# Patient Record
Sex: Female | Born: 1950 | Race: Black or African American | Hispanic: No | Smoking: Never smoker
Health system: Southern US, Community
[De-identification: ages and names within clinical notes are randomized; demographics above are authoritative.]

## PROBLEM LIST (undated history)

## (undated) DIAGNOSIS — M1990 Unspecified osteoarthritis, unspecified site: Secondary | ICD-10-CM

## (undated) DIAGNOSIS — K219 Gastro-esophageal reflux disease without esophagitis: Secondary | ICD-10-CM

## (undated) DIAGNOSIS — T7840XA Allergy, unspecified, initial encounter: Secondary | ICD-10-CM

## (undated) HISTORY — DX: Allergy, unspecified, initial encounter: T78.40XA

## (undated) HISTORY — DX: Unspecified osteoarthritis, unspecified site: M19.90

## (undated) HISTORY — PX: ROTATOR CUFF REPAIR: SHX139

## (undated) HISTORY — PX: PARTIAL HYSTERECTOMY: SHX80

## (undated) HISTORY — PX: HAND TENDON SURGERY: SHX663

## (undated) HISTORY — PX: BREAST LUMPECTOMY: SHX2

## (undated) HISTORY — DX: Gastro-esophageal reflux disease without esophagitis: K21.9

---

## 2015-06-25 HISTORY — PX: COLONOSCOPY: SHX174

## 2018-11-16 ENCOUNTER — Other Ambulatory Visit: Payer: Self-pay | Admitting: Internal Medicine

## 2018-11-16 DIAGNOSIS — Z1231 Encounter for screening mammogram for malignant neoplasm of breast: Secondary | ICD-10-CM

## 2019-01-02 ENCOUNTER — Ambulatory Visit
Admission: RE | Admit: 2019-01-02 | Discharge: 2019-01-02 | Disposition: A | Discharge status: Home / Self Care | Payer: Medicare Other | Source: Ambulatory Visit | Attending: Internal Medicine | Admitting: Internal Medicine

## 2019-01-02 ENCOUNTER — Other Ambulatory Visit: Payer: Self-pay

## 2019-01-02 DIAGNOSIS — Z1231 Encounter for screening mammogram for malignant neoplasm of breast: Secondary | ICD-10-CM

## 2019-05-27 ENCOUNTER — Other Ambulatory Visit: Payer: Self-pay | Admitting: Internal Medicine

## 2019-05-27 DIAGNOSIS — R1032 Left lower quadrant pain: Secondary | ICD-10-CM

## 2019-06-05 ENCOUNTER — Ambulatory Visit
Admission: RE | Admit: 2019-06-05 | Discharge: 2019-06-05 | Disposition: A | Discharge status: Home / Self Care | Payer: Medicare PPO | Source: Ambulatory Visit | Attending: Internal Medicine | Admitting: Internal Medicine

## 2019-06-05 ENCOUNTER — Other Ambulatory Visit: Payer: Self-pay

## 2019-06-05 DIAGNOSIS — R1032 Left lower quadrant pain: Secondary | ICD-10-CM

## 2019-06-05 MED ORDER — IOPAMIDOL (ISOVUE-300) INJECTION 61%
100.0000 mL | Freq: Once | INTRAVENOUS | Status: AC | PRN
  Administered 2019-06-05: 100 mL via INTRAVENOUS

## 2019-06-07 ENCOUNTER — Other Ambulatory Visit: Payer: Self-pay | Admitting: Internal Medicine

## 2019-11-27 ENCOUNTER — Other Ambulatory Visit: Payer: Self-pay | Admitting: Internal Medicine

## 2019-11-27 DIAGNOSIS — Z1231 Encounter for screening mammogram for malignant neoplasm of breast: Secondary | ICD-10-CM

## 2020-01-08 ENCOUNTER — Ambulatory Visit: Payer: Medicare PPO

## 2020-01-10 ENCOUNTER — Encounter: Payer: Self-pay | Admitting: Gastroenterology

## 2020-01-17 ENCOUNTER — Other Ambulatory Visit: Payer: Self-pay

## 2020-01-17 ENCOUNTER — Ambulatory Visit: Payer: Medicare PPO

## 2020-01-17 ENCOUNTER — Ambulatory Visit
Admission: RE | Admit: 2020-01-17 | Discharge: 2020-01-17 | Disposition: A | Discharge status: Home / Self Care | Payer: Medicare PPO | Source: Ambulatory Visit | Attending: Internal Medicine | Admitting: Internal Medicine

## 2020-01-17 DIAGNOSIS — Z1231 Encounter for screening mammogram for malignant neoplasm of breast: Secondary | ICD-10-CM

## 2020-01-17 IMAGING — MG DIGITAL SCREENING BILAT W/ TOMO W/ CAD
6 of 10 series · 6 of 30 positions shown · non-contrast
Comparison: Previous exam(s).

CLINICAL DATA: Screening.

EXAM:
DIGITAL SCREENING BILATERAL MAMMOGRAM WITH TOMO AND CAD

[R MLO synth-2D (1 of 2)]
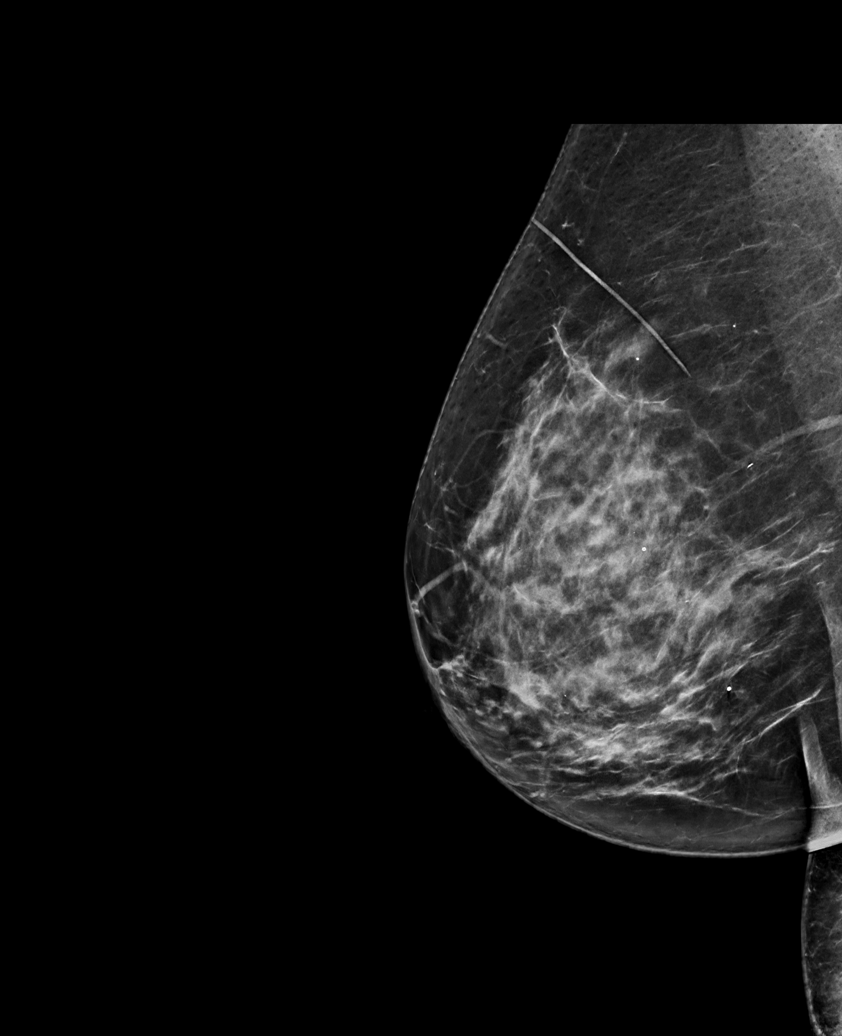

[L CC synth-2D]
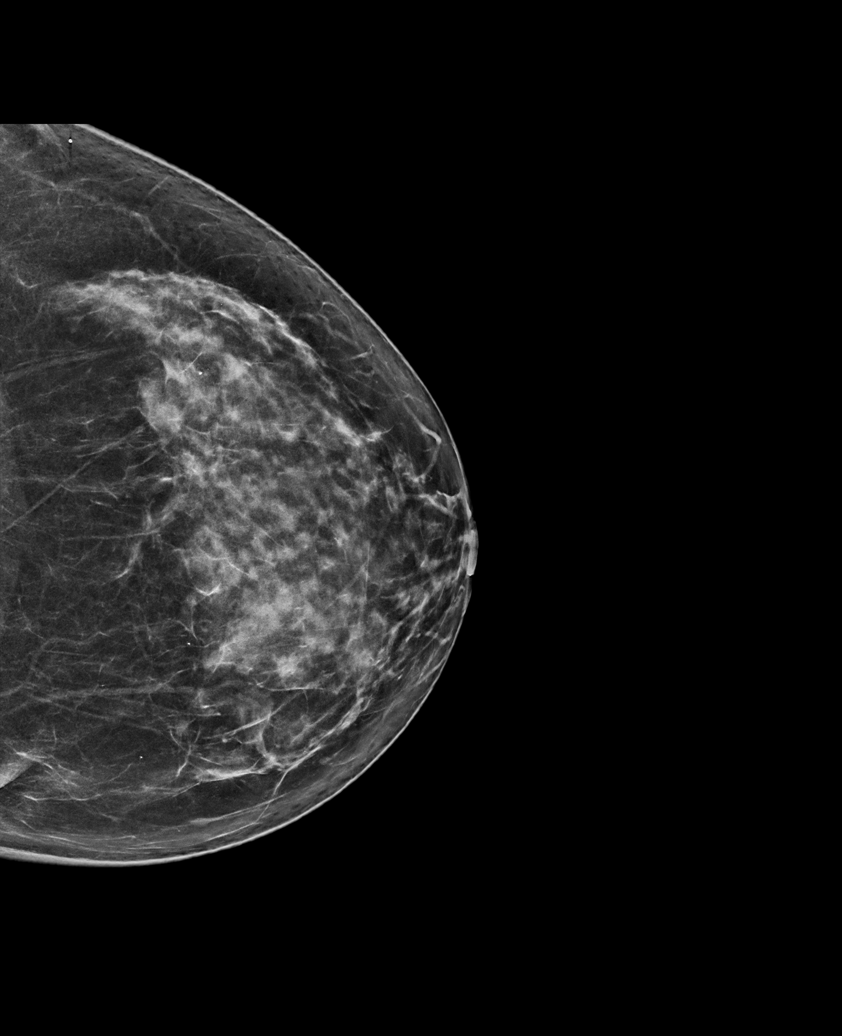

[R CC synth-2D]
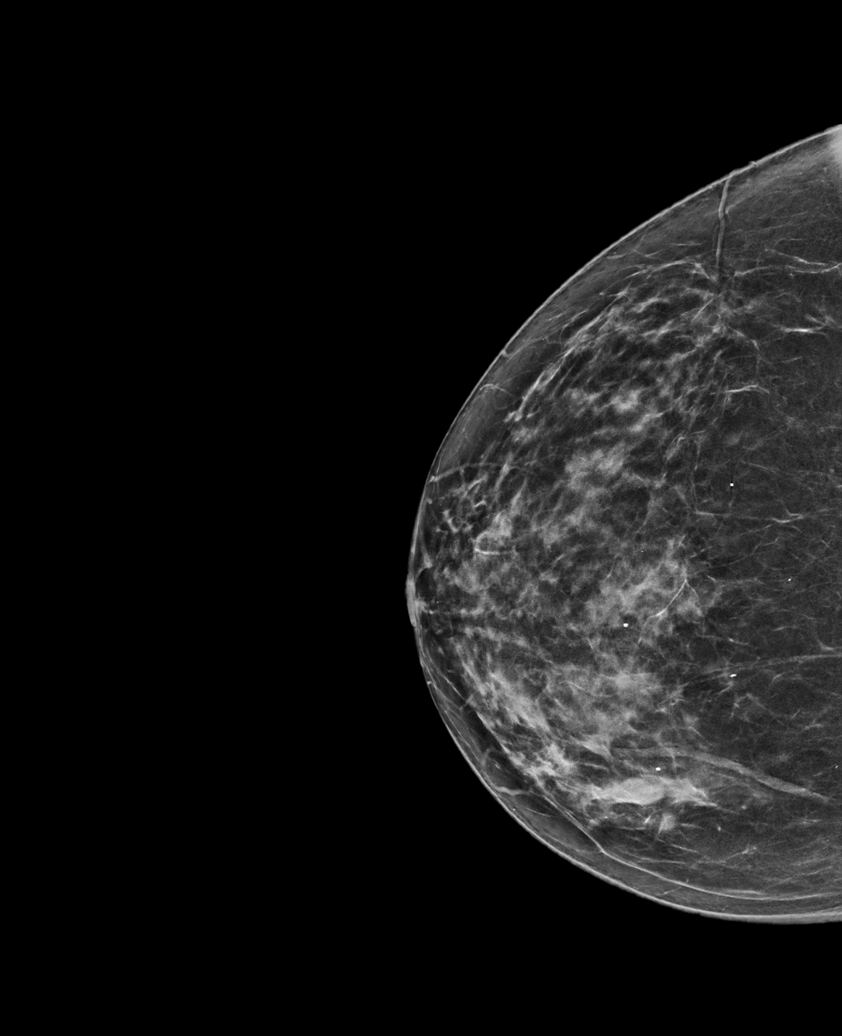

[R MLO synth-2D (2 of 2)]
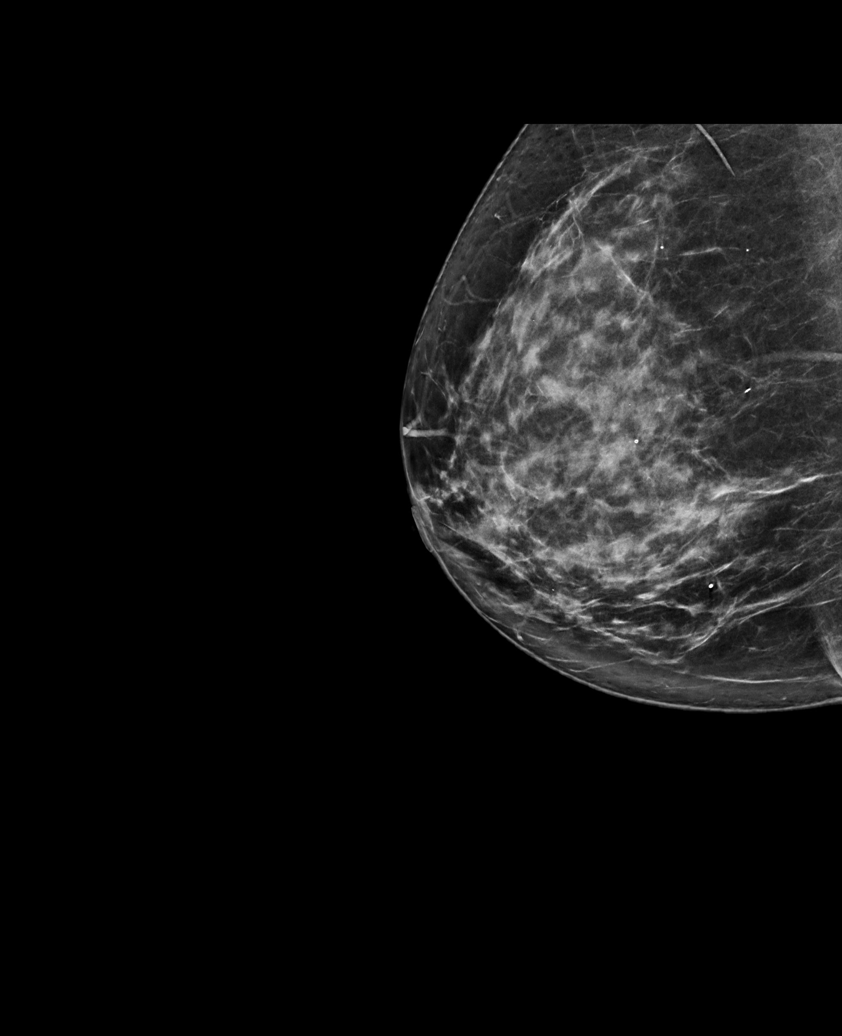

[L MLO synth-2D]
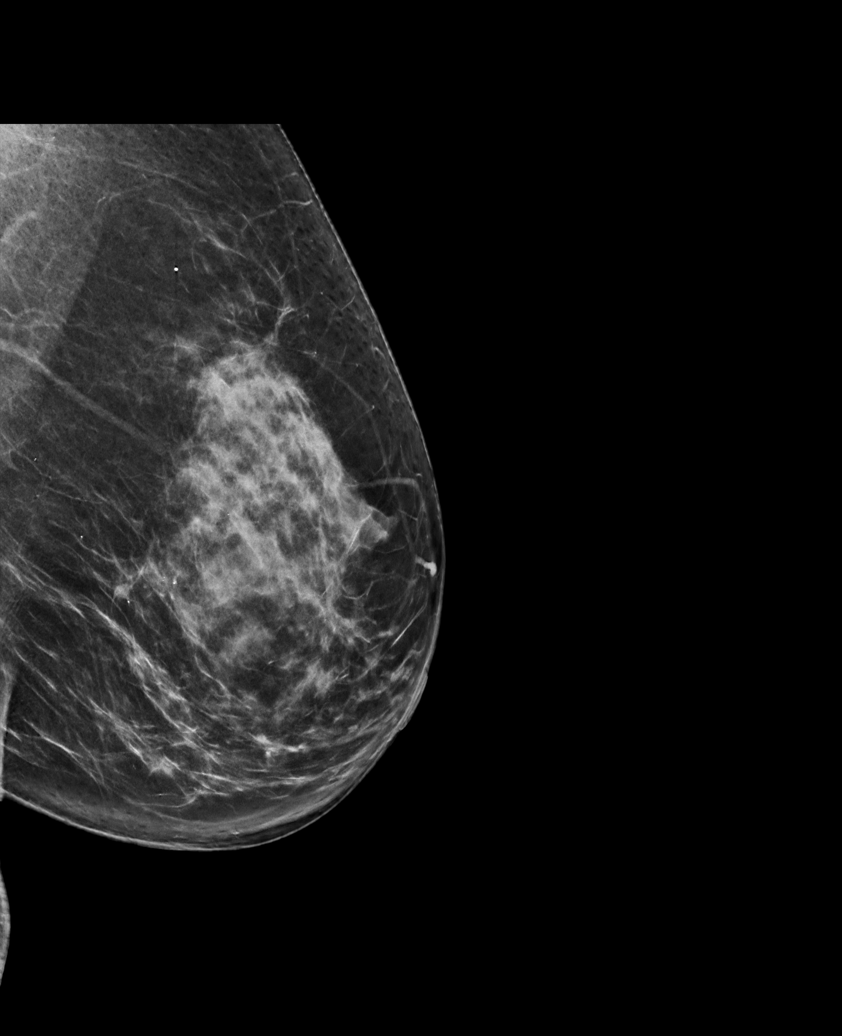

[L CC tomo · tomo slice 37/73.0]
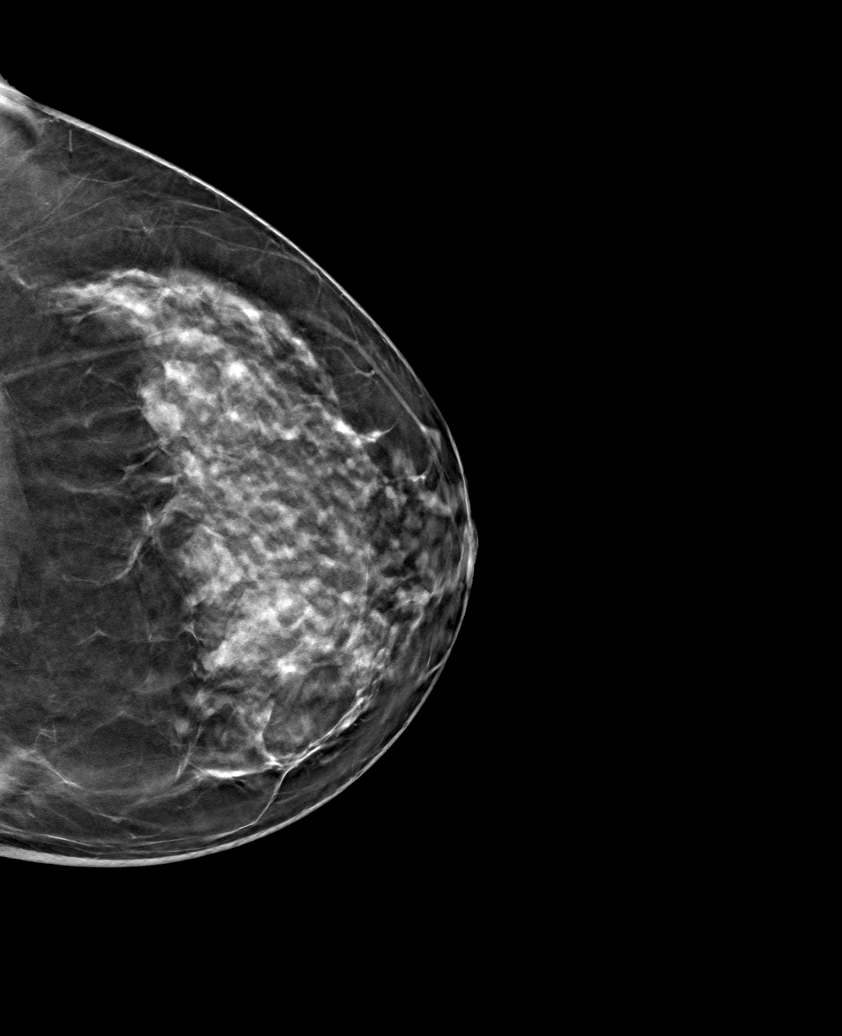

[6 of 30 positions shown; findings below may reference images not displayed]

ACR Breast Density Category c: The breast tissue is heterogeneously
dense, which may obscure small masses.
FINDINGS: There are no findings suspicious for malignancy. Images were
processed with CAD.
IMPRESSION: No mammographic evidence of malignancy. A result letter of this
screening mammogram will be mailed directly to the patient.

RECOMMENDATION:
Screening mammogram in one year. (Code:[5V])

BI-RADS CATEGORY  1: Negative.

## 2020-01-24 ENCOUNTER — Other Ambulatory Visit: Payer: Self-pay | Admitting: Internal Medicine

## 2020-01-24 DIAGNOSIS — R1032 Left lower quadrant pain: Secondary | ICD-10-CM

## 2020-02-13 ENCOUNTER — Other Ambulatory Visit: Payer: Self-pay

## 2020-02-13 ENCOUNTER — Ambulatory Visit
Admission: RE | Admit: 2020-02-13 | Discharge: 2020-02-13 | Disposition: A | Discharge status: Home / Self Care | Payer: Medicare PPO | Source: Ambulatory Visit | Attending: Internal Medicine | Admitting: Internal Medicine

## 2020-02-13 DIAGNOSIS — R1032 Left lower quadrant pain: Secondary | ICD-10-CM

## 2020-02-13 MED ORDER — IOPAMIDOL (ISOVUE-300) INJECTION 61%
100.0000 mL | Freq: Once | INTRAVENOUS | Status: AC | PRN
  Administered 2020-02-13: 100 mL via INTRAVENOUS

## 2020-02-22 ENCOUNTER — Ambulatory Visit: Payer: Medicare PPO

## 2020-04-06 ENCOUNTER — Other Ambulatory Visit: Payer: Self-pay | Admitting: Internal Medicine

## 2020-04-06 DIAGNOSIS — R5381 Other malaise: Secondary | ICD-10-CM

## 2020-04-10 ENCOUNTER — Other Ambulatory Visit: Payer: Self-pay | Admitting: Internal Medicine

## 2020-04-10 DIAGNOSIS — M8589 Other specified disorders of bone density and structure, multiple sites: Secondary | ICD-10-CM

## 2020-04-23 ENCOUNTER — Encounter: Payer: Self-pay | Admitting: Gastroenterology

## 2020-04-23 ENCOUNTER — Ambulatory Visit: Payer: Medicare PPO | Admitting: Gastroenterology

## 2020-04-23 VITALS — BP 134/92 | HR 88 | Ht 68.0 in | Wt 162.5 lb

## 2020-04-23 DIAGNOSIS — Z86010 Personal history of colonic polyps: Secondary | ICD-10-CM | POA: Diagnosis not present

## 2020-04-23 DIAGNOSIS — Z800 Family history of malignant neoplasm of digestive organs: Secondary | ICD-10-CM

## 2020-04-23 MED ORDER — CLENPIQ 10-3.5-12 MG-GM -GM/160ML PO SOLN: 1.0000 | Freq: Once | ORAL | 0 refills | Status: AC

## 2020-04-23 NOTE — Progress Notes (Signed)
Chief Complaint: For colonoscopy.  Referring Provider:  Bonnita Nasuti, MD      ASSESSMENT AND PLAN;   #1. 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 FH colon Ca (sis at age 70)  #3. Abn CT showing liver lesion-likely hemangioma.  Radiology had advised MRI.  Patient wanted to hold off.  Plan: -Colon (clenpiq) for further evaluation.  Discussed risks and benefits. -Blood test test results from Dr Tenna Delaine office in [place] report given. She will discuss with her daughter Investment banker, corporate) and then decide about MRI liver.   HPI:    Audrey Pratt is a 70 y.o. female   For colonoscopy. Occ constipation, better with fiber. Otherwise no GI problems.  No melena or hematochezia.  No weight loss.  She had intermittent left lower quadrant abdominal pain previously.  Underwent CT Abdo/pelvis 02/13/2020 which was negative for any acute abnormalities.  It did show a 3 cm right hepatic lesion likely hemangioma.  Patient refused MRI after.  Reflux symptoms like heartburn is better on omeprazole 20mg  po qd  Sis- had colon Ca at 44 yr  Daughter is a Marine scientist.  I have given CT report to the patient.  She will run it by her daughter and decide.  CT Abdo/pelvis with contrast 02/13/2020 1. No acute intra-abdominal or pelvic pathology. No bowel obstruction. Normal appendix. 2. A 15 mm left adnexal cyst similar to prior CT. Further evaluation with ultrasound recommended. 3. A 3 cm indeterminate right hepatic mass, likely a hemangioma. MRI may provide better characterization. 4. Aortic Atherosclerosis (ICD10-I70.0).  EGD-we do not have the actual report.  She does bring in pictures 06/25/2015: Small hiatal hernia, erosive esophagitis.  We do have biopsies which were negative for Barrett's.  Colonoscopy 06/25/2015: We do not have the actual report.  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  Also had prominent ileocecal valve with negative biopsies.  Advised to repeat in 5 years.  Past Surgical  History:  Procedure Laterality Date  . BREAST LUMPECTOMY    . COLONOSCOPY  06/25/2015   every 5 years  . HAND TENDON SURGERY Right   . PARTIAL HYSTERECTOMY    . ROTATOR CUFF REPAIR Right     Family History  Problem Relation Age of Onset  . Heart disease Mother   . Prostate cancer Father   . Heart disease Father   . Colon cancer Sister   . Throat cancer Brother   . Prostate cancer Brother   . Stomach cancer Maternal Grandmother     Social History   Tobacco Use  . Smoking status: Never Smoker  . Smokeless tobacco: Never Used  Substance Use Topics  . Alcohol use: Never  . Drug use: Never    Current Outpatient Medications  Medication Sig Dispense Refill  . aspirin 81 MG chewable tablet 1 tablet    . Calcium Carbonate+Vitamin D 600-200 MG-UNIT TABS 1 tablet with a meal    . cetirizine (ZYRTEC) 10 MG tablet Zyrtec 10 mg tablet  Take 1 tablet every day by oral route.    . fluocinonide cream (LIDEX) 0.05 %     . Multiple Vitamins-Minerals (CENTRUM SILVER ULTRA WOMENS) TABS See admin instructions.    Marland Kitchen omeprazole (PRILOSEC) 20 MG capsule Take 20 mg by mouth daily. Takes 30 minutes before breakfast    . polyethylene glycol (MIRALAX / GLYCOLAX) 17 g packet 1 packet mixed with 8 ounces of fluid     No current facility-administered medications for this visit.  Not on File  Review of Systems:  Constitutional: Denies fever, chills, diaphoresis, appetite change and fatigue.  HEENT: Denies photophobia, eye pain, redness, hearing loss, ear pain, congestion, sore throat, rhinorrhea, sneezing, mouth sores, neck pain, neck stiffness and tinnitus.   Respiratory: Denies SOB, DOE, cough, chest tightness,  and wheezing.   Cardiovascular: Denies chest pain, palpitations and leg swelling.  Genitourinary: Denies dysuria, urgency, frequency, hematuria, flank pain and difficulty urinating.  Musculoskeletal: Denies myalgias, back pain, joint swelling, arthralgias and gait problem.  Skin: No  rash.  Neurological: Denies dizziness, seizures, syncope, weakness, light-headedness, numbness and headaches.  Hematological: Denies adenopathy. Easy bruising, personal or family bleeding history  Psychiatric/Behavioral: No anxiety or depression     Physical Exam:    BP (!) 134/92 (BP Location: Right Arm, Patient Position: Sitting, Cuff Size: Normal)   Pulse 88   Ht 5\' 8"  (1.727 m)   Wt 162 lb 8 oz (73.7 kg)   BMI 24.71 kg/m  Wt Readings from Last 3 Encounters:  04/23/20 162 lb 8 oz (73.7 kg)   Constitutional:  Well-developed, in no acute distress. Psychiatric: Normal mood and affect. Behavior is normal. HEENT: Pupils normal.  Conjunctivae are normal. No scleral icterus. Neck supple.  Cardiovascular: Normal rate, regular rhythm. No edema Pulmonary/chest: Effort normal and breath sounds normal. No wheezing, rales or rhonchi. Abdominal: Soft, nondistended. Nontender. Bowel sounds active throughout. There are no masses palpable. No hepatomegaly. Rectal: Deferred Neurological: Alert and oriented to person place and time. Skin: Skin is warm and dry. No rashes noted.  Data Reviewed: I have personally reviewed following labs and imaging studies     Carmell Austria, MD 04/23/2020, 10:37 AM  Cc: Bonnita Nasuti, MD

## 2020-04-23 NOTE — Patient Instructions (Addendum)
If you are age 71 or older, your body mass index should be between 23-30. Your Body mass index is 24.71 kg/m. If this is out of the aforementioned range listed, please consider follow up with your Primary Care Provider.  If you are age 28 or younger, your body mass index should be between 19-25. Your Body mass index is 24.71 kg/m. If this is out of the aformentioned range listed, please consider follow up with your Primary Care Provider.   We have given you a sample of clenpiq  You have been scheduled for a colonoscopy. Please follow written instructions given to you at your visit today.  Please pick up your prep supplies at the pharmacy within the next 1-3 days. If you use inhalers (even only as needed), please bring them with you on the day of your procedure.  Thank you,  Dr. Jackquline Denmark

## 2020-04-30 ENCOUNTER — Ambulatory Visit (AMBULATORY_SURGERY_CENTER): Payer: Medicare PPO | Admitting: Gastroenterology

## 2020-04-30 ENCOUNTER — Encounter: Payer: Self-pay | Admitting: Gastroenterology

## 2020-04-30 ENCOUNTER — Other Ambulatory Visit: Payer: Self-pay

## 2020-04-30 VITALS — BP 132/75 | HR 68 | Temp 97.1°F | Resp 15 | Ht 68.0 in | Wt 162.0 lb

## 2020-04-30 DIAGNOSIS — K635 Polyp of colon: Secondary | ICD-10-CM | POA: Diagnosis not present

## 2020-04-30 DIAGNOSIS — D120 Benign neoplasm of cecum: Secondary | ICD-10-CM

## 2020-04-30 DIAGNOSIS — K6389 Other specified diseases of intestine: Secondary | ICD-10-CM

## 2020-04-30 DIAGNOSIS — Z86010 Personal history of colonic polyps: Secondary | ICD-10-CM | POA: Diagnosis not present

## 2020-04-30 MED ORDER — SODIUM CHLORIDE 0.9 % IV SOLN: 500.0000 mL | Freq: Once | INTRAVENOUS | Status: DC

## 2020-04-30 NOTE — Progress Notes (Signed)
Called to room to assist during endoscopic procedure.  Patient ID and intended procedure confirmed with present staff. Received instructions for my participation in the procedure from the performing physician.  

## 2020-04-30 NOTE — Op Note (Signed)
[facility] Patient Name: Audrey Pratt Procedure Date: 04/30/2020 10:38 AM MRN: [id number] Endoscopist: Jackquline Denmark , MD Age: 70 Referring MD:  Date of Birth: [birth year] Gender: Female Account #: [id number] Procedure:                Colonoscopy Indications:              High risk colon cancer surveillance: Personal                            history of colonic polyps Medicines:                Monitored Anesthesia Care Procedure:                Pre-Anesthesia Assessment:                           - Prior to the procedure, a History and Physical                            was performed, and patient medications and                            allergies were reviewed. The patient's tolerance of                            previous anesthesia was also reviewed. The risks                            and benefits of the procedure and the sedation                            options and risks were discussed with the patient.                            All questions were answered, and informed consent                            was obtained. Prior Anticoagulants: The patient has                            taken no previous anticoagulant or antiplatelet                            agents. ASA Grade Assessment: II - A patient with                            mild systemic disease. After reviewing the risks                            and benefits, the patient was deemed in                            satisfactory condition to undergo the procedure.  After obtaining informed consent, the colonoscope                            was passed under direct vision. Throughout the                            procedure, the patient's blood pressure, pulse, and                            oxygen saturations were monitored continuously. The                            Olympus [id number]-[id number] (#[id number]) Colonoscope was                            introduced through the anus and advanced to  the 2                            cm into the ileum. The colonoscopy was performed                            without difficulty. The patient tolerated the                            procedure well. The quality of the bowel                            preparation was good. The terminal ileum, ileocecal                            valve, appendiceal orifice, and rectum were                            photographed. Scope In: 10:45:48 AM Scope Out: 11:00:23 AM Scope Withdrawal Time: 0 hours 11 minutes 44 seconds  Total Procedure Duration: 0 hours 14 minutes 35 seconds  Findings:                 A 6 mm polyp was found in the cecum. The polyp was                            sessile. The polyp was removed with a cold snare.                            Resection and retrieval were complete.                           The colon (entire examined portion) appeared normal                            with well preserved vascular pattern. The colon was                            highly redundant.  Non-bleeding internal hemorrhoids were found during                            retroflexion. The hemorrhoids were moderate.                           The terminal ileum appeared normal.                           The exam was otherwise without abnormality on                            direct and retroflexion views. Complications:            No immediate complications. Estimated Blood Loss:     Estimated blood loss: none. Impression:               - One 6 mm polyp in the cecum, removed with a cold                            snare. Resected and retrieved.                           - Non-bleeding internal hemorrhoids.                           - Otherwise normal colonoscopy to TI. The colon was                            highly redundant. Recommendation:           - Patient has a contact number available for                            emergencies. The signs and symptoms of potential                             delayed complications were discussed with the                            patient. Return to normal activities tomorrow.                            Written discharge instructions were provided to the                            patient.                           - Resume previous diet.                           - Continue present medications.                           - Await pathology results.                           -  Repeat colonoscopy for surveillance based on                            pathology results.                           - The findings and recommendations were discussed                            with the patient's GD Tasia.Jackquline Denmark, MD 04/30/2020 11:05:30 AM This report has been signed electronically.

## 2020-04-30 NOTE — Progress Notes (Signed)
Vs by JD

## 2020-04-30 NOTE — Progress Notes (Signed)
A and O x3. Report to RN. Tolerated MAC anesthesia well.

## 2020-04-30 NOTE — Patient Instructions (Signed)
Handouts Provided:  Polyps  YOU HAD AN ENDOSCOPIC PROCEDURE TODAY AT THE [facility]:   Refer to the procedure report that was given to you for any specific questions about what was found during the examination.  If the procedure report does not answer your questions, please call your gastroenterologist to clarify.  If you requested that your care partner not be given the details of your procedure findings, then the procedure report has been included in a sealed envelope for you to review at your convenience later.  YOU SHOULD EXPECT: Some feelings of bloating in the abdomen. Passage of more gas than usual.  Walking can help get rid of the air that was put into your GI tract during the procedure and reduce the bloating. If you had a lower endoscopy (such as a colonoscopy or flexible sigmoidoscopy) you may notice spotting of blood in your stool or on the toilet paper. If you underwent a bowel prep for your procedure, you may not have a normal bowel movement for a few days.  Please Note:  You might notice some irritation and congestion in your nose or some drainage.  This is from the oxygen used during your procedure.  There is no need for concern and it should clear up in a day or so.  SYMPTOMS TO REPORT IMMEDIATELY:   Following lower endoscopy (colonoscopy or flexible sigmoidoscopy):  Excessive amounts of blood in the stool  Significant tenderness or worsening of abdominal pains  Swelling of the abdomen that is new, acute  Fever of 100F or higher  For urgent or emergent issues, a gastroenterologist can be reached at any hour by calling [phone]. Do not use MyChart messaging for urgent concerns.    DIET:  We do recommend a small meal at first, but then you may proceed to your regular diet.  Drink plenty of fluids but you should avoid alcoholic beverages for 24 hours.  ACTIVITY:  You should plan to take it easy for the rest of today and you should NOT DRIVE or use heavy  machinery until tomorrow (because of the sedation medicines used during the test).    FOLLOW UP: Our staff will call the number listed on your records 48-72 hours following your procedure to check on you and address any questions or concerns that you may have regarding the information given to you following your procedure. If we do not reach you, we will leave a message.  We will attempt to reach you two times.  During this call, we will ask if you have developed any symptoms of COVID 19. If you develop any symptoms (ie: fever, flu-like symptoms, shortness of breath, cough etc.) before then, please call [phone].  If you test positive for Covid 19 in the 2 weeks post procedure, please call and report this information to us.    If any biopsies were taken you will be contacted by phone or by letter within the next 1-3 weeks.  Please call us at [phone] if you have not heard about the biopsies in 3 weeks.    SIGNATURES/CONFIDENTIALITY: You and/or your care partner have signed paperwork which will be entered into your electronic medical record.  These signatures attest to the fact that that the information above on your After Visit Summary has been reviewed and is understood.  Full responsibility of the confidentiality of this discharge information lies with you and/or your care-partner.  

## 2020-05-02 ENCOUNTER — Telehealth: Payer: Self-pay

## 2020-05-02 NOTE — Telephone Encounter (Signed)
  Follow up Call-  Call back number 04/30/2020  Post procedure Call Back phone  # [phone]  Permission to leave phone message Yes     Patient questions:  Do you have a fever, pain , or abdominal swelling? No. Pain Score  0 *  Have you tolerated food without any problems? Yes.    Have you been able to return to your normal activities? Yes.    Do you have any questions about your discharge instructions: Diet   No. Medications  No. Follow up visit  No.  Do you have questions or concerns about your Care? No.  Actions: * If pain score is 4 or above: No action needed, pain <4. 1. Have you developed a fever since your procedure? no  2.   Have you had an respiratory symptoms (SOB or cough) since your procedure? no  3.   Have you tested positive for COVID 19 since your procedure no  4.   Have you had any family members/close contacts diagnosed with the COVID 19 since your procedure?  no   If yes to any of these questions please route to Joylene John, RN and Joella Prince, RN

## 2020-05-07 ENCOUNTER — Encounter: Payer: Self-pay | Admitting: Gastroenterology

## 2020-08-19 ENCOUNTER — Other Ambulatory Visit: Payer: Self-pay

## 2020-08-19 ENCOUNTER — Ambulatory Visit
Admission: RE | Admit: 2020-08-19 | Discharge: 2020-08-19 | Disposition: A | Discharge status: Home / Self Care | Payer: Medicare PPO | Source: Ambulatory Visit | Attending: Internal Medicine | Admitting: Internal Medicine

## 2020-08-19 DIAGNOSIS — M8589 Other specified disorders of bone density and structure, multiple sites: Secondary | ICD-10-CM

## 2020-09-06 ENCOUNTER — Other Ambulatory Visit: Payer: Self-pay | Admitting: Internal Medicine

## 2020-09-11 ENCOUNTER — Other Ambulatory Visit: Payer: Self-pay | Admitting: Internal Medicine

## 2020-09-11 DIAGNOSIS — R1084 Generalized abdominal pain: Secondary | ICD-10-CM

## 2020-10-02 ENCOUNTER — Ambulatory Visit
Admission: RE | Admit: 2020-10-02 | Discharge: 2020-10-02 | Disposition: A | Discharge status: Home / Self Care | Payer: Medicare PPO | Source: Ambulatory Visit | Attending: Internal Medicine | Admitting: Internal Medicine

## 2020-10-02 ENCOUNTER — Other Ambulatory Visit: Payer: Self-pay

## 2020-10-02 DIAGNOSIS — R1084 Generalized abdominal pain: Secondary | ICD-10-CM

## 2020-10-02 MED ORDER — IOPAMIDOL (ISOVUE-300) INJECTION 61%
100.0000 mL | Freq: Once | INTRAVENOUS | Status: AC | PRN
  Administered 2020-10-02: 100 mL via INTRAVENOUS

## 2020-10-15 ENCOUNTER — Other Ambulatory Visit: Payer: Self-pay | Admitting: Internal Medicine

## 2020-10-15 DIAGNOSIS — N644 Mastodynia: Secondary | ICD-10-CM

## 2020-11-15 ENCOUNTER — Ambulatory Visit
Admission: RE | Admit: 2020-11-15 | Discharge: 2020-11-15 | Disposition: A | Discharge status: Home / Self Care | Payer: Medicare PPO | Source: Ambulatory Visit | Attending: Internal Medicine | Admitting: Internal Medicine

## 2020-11-15 ENCOUNTER — Ambulatory Visit: Payer: Medicare PPO

## 2020-11-15 ENCOUNTER — Other Ambulatory Visit: Payer: Self-pay

## 2020-11-15 DIAGNOSIS — N644 Mastodynia: Secondary | ICD-10-CM

## 2021-02-14 ENCOUNTER — Other Ambulatory Visit: Payer: Self-pay | Admitting: Internal Medicine

## 2021-02-14 DIAGNOSIS — Z1231 Encounter for screening mammogram for malignant neoplasm of breast: Secondary | ICD-10-CM

## 2021-03-21 ENCOUNTER — Ambulatory Visit
Admission: RE | Admit: 2021-03-21 | Discharge: 2021-03-21 | Disposition: A | Discharge status: Home / Self Care | Payer: Medicare PPO | Source: Ambulatory Visit | Attending: Internal Medicine | Admitting: Internal Medicine

## 2021-03-21 DIAGNOSIS — Z1231 Encounter for screening mammogram for malignant neoplasm of breast: Secondary | ICD-10-CM

## 2021-03-28 ENCOUNTER — Ambulatory Visit: Payer: Medicare PPO | Admitting: Gastroenterology

## 2021-04-15 ENCOUNTER — Other Ambulatory Visit: Payer: Self-pay

## 2021-04-15 ENCOUNTER — Ambulatory Visit: Payer: Medicare PPO | Admitting: Gastroenterology

## 2021-04-15 ENCOUNTER — Encounter: Payer: Self-pay | Admitting: Gastroenterology

## 2021-04-15 VITALS — BP 128/80 | HR 71 | Ht 67.0 in | Wt 145.4 lb

## 2021-04-15 DIAGNOSIS — Z800 Family history of malignant neoplasm of digestive organs: Secondary | ICD-10-CM

## 2021-04-15 DIAGNOSIS — Z86010 Personal history of colonic polyps: Secondary | ICD-10-CM

## 2021-04-15 MED ORDER — FLUTICASONE PROPIONATE 0.05 % EX CREA: TOPICAL_CREAM | Freq: Two times a day (BID) | CUTANEOUS | 2 refills | Status: DC

## 2021-04-15 NOTE — Progress Notes (Signed)
Chief Complaint: For FU  Referring Provider:  Bonnita Nasuti, MD      ASSESSMENT AND PLAN;   #1. IBS-C. Neg colon 04/2020.  #2. FH colon Ca (sis at age 71). Rpt colon due 04/2025.  #3. Abn CT showing liver lesion-likely hemangioma.  Radiology had advised MRI.  Patient wanted to hold off.  #4.  Internal hemorrhoids.  Plan:  -Benefiber 1 TBS p.o. QD with 8 oz of water. -Salads 1/QOD. -fluticasone cream 0.05% generic 30g 1 bid PR x 10 days, 2 refills -If still with problems in 2 weeks, add Mg 400mg /day. -Continue Linzess for now. May be able to wean off   HPI:    Audrey Pratt is a 71 y.o. female  For follow-up visit.  Continues to struggle with constipation.  Was better with high-fiber diet.  Currently on Linzess 290 mcg po QD. has not been drinking enough water.  Bms 1/day but hard.  Recently had hemorrhoidal banding by ?? Dr Collene Mares (could only tell me that it was female gastroenterologist).  Has lower abdominal discomfort which gets better with defecation.  Denies having any nausea, vomiting.  Underwent CT Abdo/pelvis 02/13/2020 which was negative for any acute abnormalities.  It did show a 3 cm right hepatic lesion likely hemangioma.  Patient refused MRI after.  Reflux symptoms like heartburn is better on omeprazole 20mg  po qd  Sis- had colon Ca at 74 yr  Daughter is a Marine scientist.  I have given CT report to the patient previously.     Past GI work-up: Colon 04/30/2020 - One 6 mm polyp in the cecum, removed with a cold snare. Resected and retrieved. - Non-bleeding internal hemorrhoids. - Otherwise normal colonoscopy to TI. The colon was highly redundant. - Rpt in 5 yrs d/t FH  CT Abdo/pelvis with contrast 02/13/2020 1. No acute intra-abdominal or pelvic pathology. No bowel obstruction. Normal appendix. 2. A 15 mm left adnexal cyst similar to prior CT. Further evaluation with ultrasound recommended. 3. A 3 cm indeterminate right hepatic mass, likely a hemangioma.  MRI may provide better characterization. 4. Aortic Atherosclerosis (ICD10-I70.0).  EGD-we do not have the actual report.  She does bring in pictures 06/25/2015: Small hiatal hernia, erosive esophagitis.  We do have biopsies which were negative for Barrett's.  Colonoscopy 06/25/2015: We do not have the actual report.  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  Also had prominent ileocecal valve with negative biopsies.  Advised to repeat in 5 years.  Past Surgical History:  Procedure Laterality Date   BREAST LUMPECTOMY Right    COLONOSCOPY  06/25/2015   every 5 years   HAND TENDON SURGERY Right    PARTIAL HYSTERECTOMY     ROTATOR CUFF REPAIR Right     Family History  Problem Relation Age of Onset   Heart disease Mother    Prostate cancer Father    Heart disease Father    Colon cancer Sister    Throat cancer Brother    Prostate cancer Brother    Stomach cancer Maternal Grandmother    Rectal cancer Neg Hx    Esophageal cancer Neg Hx     Social History   Tobacco Use   Smoking status: Never   Smokeless tobacco: Never  Vaping Use   Vaping Use: Never used  Substance Use Topics   Alcohol use: Never   Drug use: Never    Current Outpatient Medications  Medication Sig Dispense Refill   aspirin 81 MG chewable tablet 1  tablet     Calcium Carbonate+Vitamin D 600-200 MG-UNIT TABS 1 tablet with a meal     cetirizine (ZYRTEC) 10 MG tablet Zyrtec 10 mg tablet  Take 1 tablet every day by oral route.     Cholecalciferol (VITAMIN D3 PO) Take 1 tablet by mouth daily in the afternoon.     fluocinonide cream (LIDEX) 0.05 %      linaclotide (LINZESS) 290 MCG CAPS capsule 1 capsule daily in the afternoon.     Multiple Vitamins-Minerals (CENTRUM SILVER ULTRA WOMENS) TABS See admin instructions.     Multiple Vitamins-Minerals (ZINC PO) Take 1 tablet by mouth every other day.     omeprazole (PRILOSEC) 20 MG capsule Take 20 mg by mouth daily. Takes 30 minutes before breakfast      polyethylene glycol (MIRALAX / GLYCOLAX) 17 g packet 1 packet mixed with 8 ounces of fluid     No current facility-administered medications for this visit.    Allergies  Allergen Reactions   Latex Rash    Review of Systems:  neg     Physical Exam:    BP 128/80    Pulse 71    Ht 5\' 7"  (1.702 m)    Wt 145 lb 6 oz (65.9 kg)    BMI 22.77 kg/m  Wt Readings from Last 3 Encounters:  04/15/21 145 lb 6 oz (65.9 kg)  04/30/20 162 lb (73.5 kg)  04/23/20 162 lb 8 oz (73.7 kg)   Constitutional:  Well-developed, in no acute distress. Psychiatric: Normal mood and affect. Behavior is normal. HEENT: Conjunctivae are normal. No scleral icterus. Cardiovascular: Normal rate, regular rhythm. No edema Pulmonary/chest: Effort normal and breath sounds normal. No wheezing, rales or rhonchi. Abdominal: Soft, nondistended. Nontender. Bowel sounds active throughout. There are no masses palpable. No hepatomegaly. Rectal: Deferred Neurological: Alert and oriented to person place and time. Skin: Skin is warm and dry. No rashes noted.  Data Reviewed: I have personally reviewed following labs and imaging studies     Carmell Austria, MD 04/15/2021, 4:18 PM  Cc: Bonnita Nasuti, MD

## 2021-04-15 NOTE — Patient Instructions (Addendum)
If you are age 72 or older, your body mass index should be between 23-30. Your Body mass index is 22.77 kg/m. If this is out of the aforementioned range listed, please consider follow up with your Primary Care Provider. __________________________________________________________  The [facility] providers would like to encourage you to use [facility] to communicate with providers for non-urgent requests or questions.  Due to long hold times on the telephone, sending your provider a message by [facility] may be a faster and more efficient way to get a response.  Please allow 48 business hours for a response.  Please remember that this is for non-urgent requests.    Due to recent changes in healthcare laws, you may see the results of your imaging and laboratory studies on MyChart before your provider has had a chance to review them.  We understand that in some cases there may be results that are confusing or concerning to you. Not all laboratory results come back in the same time frame and the provider may be waiting for multiple results in order to interpret others.  Please give [place] 48 hours in order for your provider to thoroughly review all the results before contacting the office for clarification of your results.    We have sent the following medications to your pharmacy for you to pick up at your convenience: Fluticasone cream.  Please take Benefiber 1 TBS everyday with 8 oz of water. Take Salads every other day. If still with problems in 2 weeks, you can add Magnesia 400mg  everyday Continue Linzess for now.  It was a pleasure to see you today!  Jackquline Denmark, M.D.

## 2021-06-27 ENCOUNTER — Encounter: Payer: Self-pay | Admitting: Gastroenterology

## 2021-06-27 ENCOUNTER — Ambulatory Visit (INDEPENDENT_AMBULATORY_CARE_PROVIDER_SITE_OTHER): Payer: Medicare PPO | Admitting: Gastroenterology

## 2021-06-27 VITALS — BP 122/78 | HR 84 | Ht 67.0 in | Wt 142.2 lb

## 2021-06-27 DIAGNOSIS — R9389 Abnormal findings on diagnostic imaging of other specified body structures: Secondary | ICD-10-CM

## 2021-06-27 DIAGNOSIS — K581 Irritable bowel syndrome with constipation: Secondary | ICD-10-CM | POA: Diagnosis not present

## 2021-06-27 NOTE — Progress Notes (Signed)
? ? ?Chief Complaint: For FU ? ?Referring Provider:  Bonnita Nasuti, MD    ? ? ?ASSESSMENT AND PLAN;  ? ?#1. IBS-C. Neg colon 04/2020. ? ?#2. FH colon Ca (sis at age 71). Rpt colon due 04/2025. ? ?#3. Abn CT 09/2020 showing liver lesion-likely hemangioma.  Radiology had advised MRI.  Patient wanted to hold off. ? ?#4.  Internal hemorrhoids. Better with fluticasone cream 0.05%  ? ?Plan: ? ?-Benefiber 1 TBS p.o. QD with 8 oz of water. ?-Salads 1/QOD. ?-Add Mg '200mg'$ /day (Nl Cr) ?-Continue Linzess 243mg po QOD.  ?-She will call in 2 weeks. ?-Obtain latest blood tests from Dr HTenna Delaineoffice. ? ? ?HPI:   ? ?MRichard Holzis a 71y.o. female  ?For follow-up visit. ? ?Doing better from constipation standpoint.  About 70 to 80% better with fiber, salads and Linzess every other day.  Pleased with the progress. ? ?The hemorrhoids are much better with current treatment. ? ?Denies having any nausea, vomiting. ? ?Underwent CT Abdo/pelvis 02/13/2020 which was negative for any acute abnormalities.  It did show a 3 cm right hepatic lesion likely hemangioma.  Patient refused MRI after. ? ?Reflux symptoms like heartburn is better on omeprazole '20mg'$  po qd ? ?Sis- had colon Ca at 89yr ? ?Daughter is a nMarine scientist  I have given CT report to the patient previously.   ? ? ?Past GI work-up: ?Colon 04/30/2020 ?- One 6 mm polyp in the cecum, removed with a cold snare. Resected and retrieved. ?- Non-bleeding internal hemorrhoids. ?- Otherwise normal colonoscopy to TI. The colon was highly redundant. ?- Rpt in 5 yrs d/t FH ? ?CT AP with contrast 09/2020 ?Large amount of stool seen throughout the colon. ?No acute abnormality seen in the abdomen or pelvis. ?Aortic Atherosclerosis (ICD10-I70.0). ? ?CT Abdo/pelvis with contrast 02/13/2020 ?1. No acute intra-abdominal or pelvic pathology. No bowel ?obstruction. Normal appendix. ?2. A 15 mm left adnexal cyst similar to prior CT. Further evaluation ?with ultrasound recommended. ?3. A 3 cm indeterminate right  hepatic mass, likely a hemangioma. MRI ?may provide better characterization. ?4. Aortic Atherosclerosis (ICD10-I70.0). ? ?EGD-we do not have the actual report.  She does bring in pictures 06/25/2015: Small hiatal hernia, erosive esophagitis.  We do have biopsies which were negative for Barrett's. ? ?Colonoscopy 06/25/2015: We do not have the actual report.  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  Also had prominent ileocecal valve with negative biopsies.  Advised to repeat in 5 years. ? ?Past Surgical History:  ?Procedure Laterality Date  ? BREAST LUMPECTOMY Right   ? COLONOSCOPY  06/25/2015  ? every 5 years  ? HAND TENDON SURGERY Right   ? PARTIAL HYSTERECTOMY    ? ROTATOR CUFF REPAIR Right   ? ? ?Family History  ?Problem Relation Age of Onset  ? Heart disease Mother   ? Prostate cancer Father   ? Heart disease Father   ? Colon cancer Sister   ? Throat cancer Brother   ? Prostate cancer Brother   ? Stomach cancer Maternal Grandmother   ? Rectal cancer Neg Hx   ? Esophageal cancer Neg Hx   ? ? ?Social History  ? ?Tobacco Use  ? Smoking status: Never  ? Smokeless tobacco: Never  ?Vaping Use  ? Vaping Use: Never used  ?Substance Use Topics  ? Alcohol use: Never  ? Drug use: Never  ? ? ?Current Outpatient Medications  ?Medication Sig Dispense Refill  ? aspirin 81 MG chewable tablet 1  tablet    ? Calcium Carbonate+Vitamin D 600-200 MG-UNIT TABS 1 tablet with a meal    ? cetirizine (ZYRTEC) 10 MG tablet Zyrtec 10 mg tablet ? Take 1 tablet every day by oral route.    ? Cholecalciferol (VITAMIN D3 PO) Take 1 tablet by mouth daily in the afternoon.    ? fluocinonide cream (LIDEX) 0.05 %     ? fluticasone (CUTIVATE) 0.05 % cream Apply topically 2 (two) times daily. 30 g 2  ? linaclotide (LINZESS) 290 MCG CAPS capsule 1 capsule daily in the afternoon.    ? Multiple Vitamins-Minerals (CENTRUM SILVER ULTRA WOMENS) TABS See admin instructions.    ? Multiple Vitamins-Minerals (ZINC PO) Take 1 tablet by mouth every  other day.    ? omeprazole (PRILOSEC) 20 MG capsule Take 20 mg by mouth daily. Takes 30 minutes before breakfast    ? Wheat Dextrin (BENEFIBER PO) Take 3 tablets by mouth 3 (three) times daily with meals.    ? ?No current facility-administered medications for this visit.  ? ? ?Allergies  ?Allergen Reactions  ? Latex Rash  ? ? ?Review of Systems:  ?neg ? ?  ? ?Physical Exam:   ? ?BP 122/78   Pulse 84   Ht '5\' 7"'$  (1.702 m)   Wt 142 lb 4 oz (64.5 kg)   SpO2 99%   BMI 22.28 kg/m?  ?Wt Readings from Last 3 Encounters:  ?06/27/21 142 lb 4 oz (64.5 kg)  ?04/15/21 145 lb 6 oz (65.9 kg)  ?04/30/20 162 lb (73.5 kg)  ? ?Constitutional:  Well-developed, in no acute distress. ?Psychiatric: Normal mood and affect. Behavior is normal. ?HEENT: Conjunctivae are normal. No scleral icterus. ?Cardiovascular: Normal rate, regular rhythm. No edema ?Pulmonary/chest: Effort normal and breath sounds normal. No wheezing, rales or rhonchi. ?Abdominal: Soft, nondistended. Nontender. Bowel sounds active throughout. There are no masses palpable. No hepatomegaly. ?Rectal: Deferred ?Neurological: Alert and oriented to person place and time. ?Skin: Skin is warm and dry. No rashes noted. ? ?Data Reviewed: I have personally reviewed following labs and imaging studies ? ? ? ? ?Carmell Austria, MD 06/27/2021, 2:13 PM ? ?Cc: Hague, Rosalyn Charters, MD ? ? ?

## 2021-06-27 NOTE — Patient Instructions (Addendum)
If you are age 72 or older, your body mass index should be between 23-30. Your Body mass index is 22.28 kg/m?Marland Kitchen If this is out of the aforementioned range listed, please consider follow up with your Primary Care Provider. ? ?If you are age 47 or younger, your body mass index should be between 19-25. Your Body mass index is 22.28 kg/m?Marland Kitchen If this is out of the aformentioned range listed, please consider follow up with your Primary Care Provider.  ? ?________________________________________________________ ? ?The [place] [facility] providers would like to encourage you to use [facility] to communicate with providers for non-urgent requests or questions.  Due to long hold times on the telephone, sending your provider a message by [facility] may be a faster and more efficient way to get a response.  Please allow 48 business hours for a response.  Please remember that this is for non-urgent requests.  ?_______________________________________________________ ? ?Add Magnesium '200mg'$  daily ? ?Benefiber 1 tablespoon QD in 8oz of water ? ?1 salad every other day ? ?Continue Linzess 247mg every other day ? ?Call in 2 weeks with an update to the nurse ? ?Thank you, ? ?Dr. RJackquline Denmark? ? ? ? ? ?We want to thank you for [facility] your [facility]. All of our staff and providers value the relationships we have built with our patients, and it is an honor to care for you.  ? ?We are writing to let you know that [facility] on Jul 28, 2021, and we invite you to continue to see Dr. RCarmell Austriaand VGerrit Heckat the [facility] location. We are consolidating our serices at these [facility] to better provide care. Our office staff will work with you to ensure a seamless transition.  ? ?VGerrit Heck DO -Dr. CBryan Lemmawill be movig to [facility] at [address] [place] [id number] effective Jul 28, 2021.  Contact [phone]  [phone] to schedule an appointment with him.  ? ?RCarmell Austria MD- Dr. GLyndel Safewill be movig [facility] at [address] [place] [address] effective Jul 28, 2021.  Contact [phone] to schedule an appointment with him.  ? ?Requesting Medical Records ?If you need to request your medical records, please follow the instructions below. Your medical records are confidential, and a copy can be transferred to another provider or released to you or another person you designate only with your permission. ? ?There are several ways to request your medical records: ?Requests for medical records can be submitted through our practice.   ?You can also request your records electronically, in your MyChart account by selecting the ?Request Health Records? tab.  ?If you need additional information on how to request records, please go to [email or web address] choose Patient Information, then select Request Medical Records. ?To make an appointment or if you have any questions about your health care needs, please contact our office at [phone] one of our staff members will be glad to assist you. ?[facility] is committed to providing exceptional care for you and our community. Thank you for allowing [facility] serve your health care needs. ?Sincerely, ? ?SWindy Canny Director [facility] ?[facility] also offers convenient virtual care options. Sore throat? Sinus problems? Cold or flu symptoms? Get care from the comfort of home with [facility] Vis[facility] and e-Visits. Learn more about the non-emergency conditions treated and start your virtual visit at [email or web address] ?

## 2021-10-15 ENCOUNTER — Encounter: Payer: Self-pay | Admitting: Gastroenterology

## 2021-10-15 ENCOUNTER — Ambulatory Visit: Payer: Medicare PPO | Admitting: Gastroenterology

## 2021-10-15 VITALS — BP 134/80 | HR 84 | Ht 66.75 in | Wt 145.4 lb

## 2021-10-15 DIAGNOSIS — K5909 Other constipation: Secondary | ICD-10-CM

## 2021-10-15 DIAGNOSIS — K649 Unspecified hemorrhoids: Secondary | ICD-10-CM

## 2021-10-15 DIAGNOSIS — K769 Liver disease, unspecified: Secondary | ICD-10-CM

## 2021-10-15 MED ORDER — HYDROCORTISONE (PERIANAL) 2.5 % EX CREA: 1.0000 | TOPICAL_CREAM | Freq: Three times a day (TID) | CUTANEOUS | 1 refills | Status: DC

## 2021-10-15 NOTE — Patient Instructions (Signed)
_______________________________________________________  If you are age 72 or older, your body mass index should be between 23-30. Your Body mass index is 22.94 kg/m. If this is out of the aforementioned range listed, please consider follow up with your Primary Care Provider.  If you are age 46 or younger, your body mass index should be between 19-25. Your Body mass index is 22.94 kg/m. If this is out of the aformentioned range listed, please consider follow up with your Primary Care Provider.   We have sent the following medications to your pharmacy for you to pick up at your convenience: Hydrocortisone cream 2.5 % on Preparation H suppository.   You will be contacted by [facility] in the next 2 days to arrange a MRI Liver .  The number on your caller ID will be [phone], please answer when they call.  If you have not heard from them in 2 days please call [phone] to schedule.    The [place] [facility] providers would like to encourage you to use [facility] to communicate with providers for non-urgent requests or questions.  Due to long hold times on the telephone, sending your provider a message by [facility] may be a faster and more efficient way to get a response.  Please allow 48 business hours for a response.  Please remember that this is for non-urgent requests.   It was a pleasure to see you today!  Thank you for trusting me with your gastrointestinal care!    Alonza Bogus, PA-C

## 2021-10-15 NOTE — Progress Notes (Signed)
10/15/2021 Audrey Pratt [id number] 1950/12/29   HISTORY OF PRESENT ILLNESS: This is a 71 year old female is a patient of Dr. Steve Rattler.  She follows here for issues with chronic constipation.  Is currently on Linzess 290 mcg every other day alternating days with doses of magnesium.  Is also using Benefiber.  She had previously been on the Linzess every day, but at some point this year Dr. Lyndel Safe had her go to every other day and alternate that with the magnesium.  She reports that she was never having diarrhea with the Linzess on a daily basis.  Is still struggling with some constipation at this point.  Has intermittent hard stools and having to strain even though she knows that she is not supposed to.  She says that with the straining obviously she has irritated her hemorrhoids with some intermittent bright red blood on the toilet paper and in the toilet with some discomfort as well.  Colonoscopy 04/2020:  - One 6 mm polyp in the cecum, removed with a cold snare. Resected and retrieved. - Non-bleeding internal hemorrhoids. - Otherwise normal colonoscopy to TI. The colon was highly redundant.  Surgical [P], colon, cecum, polyp - BENIGN ENDOMETRIAL POLYP WITH FOCAL HYPERPLASTIC CHANGES - NO HYPERPLASIA OR MALIGNANCY IDENTIFIED - SEE COMMENT  Previously had a CT scan that showed suspicion for a liver hemangioma.  They suggested an MRI to confirm this and Dr. Lyndel Safe discussed this with her previously, but she declined at that time.  She would like to go ahead and proceed with that now.  Past Medical History:  Diagnosis Date   Allergy    Arthritis    GERD (gastroesophageal reflux disease)    Past Surgical History:  Procedure Laterality Date   BREAST LUMPECTOMY Right    COLONOSCOPY  06/25/2015   every 5 years   HAND TENDON SURGERY Right    PARTIAL HYSTERECTOMY     ROTATOR CUFF REPAIR Right     reports that she has never smoked. She has never used smokeless tobacco. She reports that she  does not drink alcohol and does not use drugs. family history includes Colon cancer in her sister; Heart disease in her father and mother; Prostate cancer in her brother and father; Stomach cancer in her maternal grandmother; Throat cancer in her brother. Allergies  Allergen Reactions   Pneumococcal Vac Polyvalent     Swelling and redness at injection site   Latex Rash      Outpatient Encounter Medications as of 10/15/2021  Medication Sig   aspirin 81 MG chewable tablet 1 tablet   Calcium Carbonate+Vitamin D 600-200 MG-UNIT TABS 1 tablet with a meal   cetirizine (ZYRTEC) 10 MG tablet Zyrtec 10 mg tablet  Take 1 tablet every day by oral route.   Cholecalciferol (VITAMIN D3 PO) Take 1 tablet by mouth daily in the afternoon.   fluocinonide cream (LIDEX) 0.05 %    fluticasone (CUTIVATE) 0.05 % cream Apply topically 2 (two) times daily.   linaclotide (LINZESS) 290 MCG CAPS capsule Take 1 capsule by mouth every other day.   magnesium 30 MG tablet Take 30 mg by mouth every other day. Alternating with Linzess   Multiple Vitamins-Minerals (CENTRUM SILVER ULTRA WOMENS) TABS See admin instructions.   Multiple Vitamins-Minerals (ZINC PO) Take 1 tablet by mouth every other day.   omeprazole (PRILOSEC) 20 MG capsule Take 20 mg by mouth daily. Takes 30 minutes before breakfast   PROLIA 60 MG/ML SOSY injection Inject 60 mg into  the skin every 6 (six) months.   Wheat Dextrin (BENEFIBER PO) Take 1-2 tablets by mouth as needed.   Zinc 50 MG TABS Take 1 capsule by mouth as needed.   meloxicam (MOBIC) 15 MG tablet Take 1 tablet by mouth as needed. (Patient not taking: Reported on 10/15/2021)   No facility-administered encounter medications on file as of 10/15/2021.     REVIEW OF SYSTEMS  : All other systems reviewed and negative except where noted in the History of Present Illness.   PHYSICAL EXAM: BP 134/80 (BP Location: Left Arm, Patient Position: Sitting, Cuff Size: Normal)   Pulse 84   Ht 5' 6.75"  (1.695 m) Comment: height measured without shoes  Wt 145 lb 6 oz (65.9 kg)   BMI 22.94 kg/m  General: Well developed female in no acute distress Head: Normocephalic and atraumatic Eyes:  Sclerae anicteric, conjunctiva pink. Ears: Normal auditory acuity. Lungs: Clear throughout to auscultation; no W/R/R. Heart: Regular rate and rhythm; no M/R/G. Abdomen: Soft, non-distended.  BS present.  Non-tender. Rectal: Some small minimally inflamed external hemorrhoids noted.  No masses felt on DRE.  Light brown stool on exam glove. Musculoskeletal: Symmetrical with no gross deformities  Skin: No lesions on visible extremities Extremities: No edema  Neurological: Alert oriented x 4, grossly non-focal Psychological:  Alert and cooperative. Normal mood and affect  ASSESSMENT AND PLAN: *Chronic constipation: Had been on Linzess 290 mcg daily, but earlier this year it was decreased to every other day and she is alternating magnesium every other day with the Linzess.  Is also using Benefiber.  I will check with Dr. Lyndel Safe to see if he is opposed to her going back to Linzess 290 mcg daily and cutting out the magnesium.  Does not ever sound like she had diarrhea with that. *Rectal bleeding: Intermittent bright red blood on the toilet paper in the toilet as well with some discomfort.  Hemorrhoids on exam today.  Was given fluticasone cream previously by Dr. Lyndel Safe.  We will try hydrocortisone instead.  Prescription sent to pharmacy. *Liver lesion: Likely hemangioma.  Previously declined MRI, but she would like to go ahead and proceed at this time.  We will schedule MRI of the liver.   CC:  Hague, Rosalyn Charters, MD

## 2021-10-28 ENCOUNTER — Telehealth: Payer: Self-pay

## 2021-10-28 NOTE — Telephone Encounter (Signed)
Left message on machine to call back  

## 2021-10-28 NOTE — Telephone Encounter (Signed)
-----   Message from Loralie Champagne, PA-C sent at 10/28/2021 10:01 AM EDT ----- Sorry, Dr. Lyndel Safe.  I had meant to send this to Marieanne Marxen.  ----- Message ----- From: Loralie Champagne, PA-C Sent: 10/28/2021  10:00 AM EDT To: Jackquline Denmark, MD  Can you check and see if she went back on daily Linzess dosing and discontinued the magnesium?  Dr. Lyndel Safe responded and said that he believes that he had decreased it to every other day because of cost issues, but he is fine with Linzess every day.  I have been out of the office for several days so just seeing all of this now.  Thank you,  Jess  ----- Message ----- From: Jackquline Denmark, MD Sent: 10/20/2021   5:22 PM EDT To: Laban Emperor Zehr, PA-C  Hi  Just catching up on messages Linzess was reduced to QOD for cost issues  Linzess QD would be great Can Stop Mg  RG  ----- Message ----- From: Loralie Champagne, PA-C Sent: 10/15/2021   5:16 PM EDT To: Jackquline Denmark, MD  Hello. I saw this patient of yours today.  She was previously on Linzess 290 mcg daily, but at some point she got reduced down to every other day and magnesium every other day alternating with the Linzess.  Are you opposed to her discontinuing the magnesium and going back to the Linzess every day?  Just wanted to check, was not sure the reasoning behind decreasing her dose.  Did not sound like she was ever having diarrhea from it.  Thank you!  Jess

## 2021-10-29 NOTE — Telephone Encounter (Signed)
Tried again to reach the pt and voice mail is full.  I will try to call the pt later

## 2021-10-30 ENCOUNTER — Telehealth: Payer: Self-pay

## 2021-10-30 NOTE — Telephone Encounter (Signed)
The pt states that she has not been on linzess or stopped mag.  She will begin linzess daily and stop mag.  She will call back with an update in a few weeks.

## 2021-10-30 NOTE — Telephone Encounter (Signed)
-----   Message from Jackquline Denmark, MD sent at 10/29/2021  4:33 PM EDT ----- Pl see RG  ----- Message ----- From: Loralie Champagne, PA-C Sent: 10/28/2021  10:00 AM EDT To: Jackquline Denmark, MD  Can you check and see if she went back on daily Linzess dosing and discontinued the magnesium?  Dr. Lyndel Safe responded and said that he believes that he had decreased it to every other day because of cost issues, but he is fine with Linzess every day.  I have been out of the office for several days so just seeing all of this now.  Thank you,  Jess  ----- Message ----- From: Jackquline Denmark, MD Sent: 10/20/2021   5:22 PM EDT To: Laban Emperor Zehr, PA-C  Hi  Just catching up on messages Linzess was reduced to QOD for cost issues  Linzess QD would be great Can Stop Mg  RG  ----- Message ----- From: Loralie Champagne, PA-C Sent: 10/15/2021   5:16 PM EDT To: Jackquline Denmark, MD  Hello. I saw this patient of yours today.  She was previously on Linzess 290 mcg daily, but at some point she got reduced down to every other day and magnesium every other day alternating with the Linzess.  Are you opposed to her discontinuing the magnesium and going back to the Linzess every day?  Just wanted to check, was not sure the reasoning behind decreasing her dose.  Did not sound like she was ever having diarrhea from it.  Thank you!  Jess

## 2021-10-30 NOTE — Telephone Encounter (Signed)
Unable to contact pt. Pt voice mail box full

## 2021-10-31 NOTE — Telephone Encounter (Signed)
Pt notified of Dr. Lyndel Safe recommendations to take Linzess 290 daily and stop magnesium: Pt stated that someone called her yesterday and notified her of this.  Pt verbalized understanding with all questions answered.

## 2021-11-02 NOTE — Progress Notes (Signed)
Agree with assessment/plan.  Raj Cayci Mcnabb, MD [facility] [phone]  

## 2021-11-06 ENCOUNTER — Ambulatory Visit (HOSPITAL_COMMUNITY)
Admission: RE | Admit: 2021-11-06 | Discharge: 2021-11-06 | Disposition: A | Discharge status: Home / Self Care | Payer: Medicare PPO | Source: Ambulatory Visit | Attending: Gastroenterology | Admitting: Gastroenterology

## 2021-11-06 DIAGNOSIS — K769 Liver disease, unspecified: Secondary | ICD-10-CM | POA: Insufficient documentation

## 2021-11-06 MED ORDER — GADOBUTROL 1 MMOL/ML IV SOLN
6.0000 mL | Freq: Once | INTRAVENOUS | Status: AC | PRN
Start: 2021-11-06 — End: 2021-11-06
  Administered 2021-11-06: 6 mL via INTRAVENOUS

## 2021-12-09 ENCOUNTER — Ambulatory Visit: Payer: Medicare PPO | Admitting: Gastroenterology

## 2021-12-09 ENCOUNTER — Encounter: Payer: Self-pay | Admitting: Gastroenterology

## 2021-12-09 VITALS — BP 120/80 | HR 62 | Ht 67.0 in | Wt 150.4 lb

## 2021-12-09 DIAGNOSIS — Z86010 Personal history of colonic polyps: Secondary | ICD-10-CM

## 2021-12-09 DIAGNOSIS — K649 Unspecified hemorrhoids: Secondary | ICD-10-CM

## 2021-12-09 DIAGNOSIS — K625 Hemorrhage of anus and rectum: Secondary | ICD-10-CM | POA: Diagnosis not present

## 2021-12-09 DIAGNOSIS — Z800 Family history of malignant neoplasm of digestive organs: Secondary | ICD-10-CM

## 2021-12-09 MED ORDER — FLUTICASONE PROPIONATE 0.05 % EX CREA: TOPICAL_CREAM | Freq: Two times a day (BID) | CUTANEOUS | 2 refills | Status: AC

## 2021-12-09 NOTE — Patient Instructions (Addendum)
_______________________________________________________  If you are age 72 or older, your body mass index should be between 23-30. Your Body mass index is 23.55 kg/m. If this is out of the aforementioned range listed, please consider follow up with your Primary Care Provider. ________________________________________________________  The [place] [facility] providers would like to encourage you to use [facility] to communicate with providers for non-urgent requests or questions.  Due to long hold times on the telephone, sending your provider a message by [facility] may be a faster and more efficient way to get a response.  Please allow 48 business hours for a response.  Please remember that this is for non-urgent requests.  _______________________________________________________  We have sent the following medications to your pharmacy for you to pick up at your convenience:  -fluticasone cream 0.05% generic 30g apply small amount to rectum twice daily for 10 days  -Avoid NSAIDs.  -Call if still with problems in 2 weeks. If still with problems, we will proceed with hemorrhoid banding with Dr Bryan Lemma.  Thank you for entrusting me with your care and choosing [facility].  Dr Lyndel Safe

## 2021-12-09 NOTE — Progress Notes (Signed)
Chief Complaint: FU  Referring Provider:  Bonnita Nasuti, MD      ASSESSMENT AND PLAN;   #1. Rectal bleeding d/t hoids  #2. IBS-C. Neg colon 04/2020.   #3. FH colon Ca (sis at age 71). Rpt colon due 04/2025.   #4. Abn CT 09/2020 showing liver lesion-likely hemangioma.  Radiology had advised MRI.  Patient wanted to hold off.  Plan: -Continue colace 1 QD with 8 oz of water. -fluticasone cream 0.05% generic 30g 1 bid PR x 10 days, 2 refills -Avoid NSAIDs. -Call if still with problems in 2 weeks. If still with problems, hoidal banding with Dr Loletha Grayer.   HPI:    Audrey Pratt is a 71 y.o. female   For FU Had rectal bleeding, seen in ED Nl CBC Has resolved. Negative recent colonoscopy February 2022 as below. Was constipated initially but now is better with increased water intake, fiber supplements and  salads. Has been started on Colace 1/day.  Has not strained since.  No further bleeding.  No abdominal pain.  Denies having any upper GI symptoms.  No nonsteroidals.  Past GI work-up: Colon 04/30/2020 - One 6 mm polyp in the cecum, removed with a cold snare. Resected and retrieved. - Non-bleeding internal hemorrhoids. - Otherwise normal colonoscopy to TI. The colon was highly redundant. - Rpt in 5 yrs d/t FH   CT AP with contrast 09/2020 Large amount of stool seen throughout the colon. No acute abnormality seen in the abdomen or pelvis. Aortic Atherosclerosis (ICD10-I70.0).   CT Abdo/pelvis with contrast 02/13/2020 1. No acute intra-abdominal or pelvic pathology. No bowel obstruction. Normal appendix. 2. A 15 mm left adnexal cyst similar to prior CT. Further evaluation with ultrasound recommended. 3. A 3 cm indeterminate right hepatic mass, likely a hemangioma. MRI may provide better characterization.  She refused MRI. 4. Aortic Atherosclerosis (ICD10-I70.0).   EGD-we do not have the actual report.  She does bring in pictures 06/25/2015: Small hiatal hernia, erosive  esophagitis.  We do have biopsies which were negative for Barrett's.   Colonoscopy 06/25/2015: We do not have the actual report.  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  Also had prominent ileocecal valve with negative biopsies.  Advised to repeat in 5 years.  SH-Melvin is a brother-in-law.  Daughter is a Marine scientist.  Past Medical History:  Diagnosis Date   Allergy    Arthritis    GERD (gastroesophageal reflux disease)     Past Surgical History:  Procedure Laterality Date   BREAST LUMPECTOMY Right    COLONOSCOPY  06/25/2015   every 5 years   HAND TENDON SURGERY Right    PARTIAL HYSTERECTOMY     ROTATOR CUFF REPAIR Right     Family History  Problem Relation Age of Onset   Heart disease Mother    Prostate cancer Father    Heart disease Father    Colon cancer Sister    Throat cancer Brother    Prostate cancer Brother    Stomach cancer Maternal Grandmother    Rectal cancer Neg Hx    Esophageal cancer Neg Hx     Social History   Tobacco Use   Smoking status: Never   Smokeless tobacco: Never  Vaping Use   Vaping Use: Never used  Substance Use Topics   Alcohol use: Never   Drug use: Never    Current Outpatient Medications  Medication Sig Dispense Refill   aspirin 81 MG chewable tablet 1 tablet  Calcium Carbonate+Vitamin D 600-200 MG-UNIT TABS 1 tablet with a meal     cetirizine (ZYRTEC) 10 MG tablet Zyrtec 10 mg tablet  Take 1 tablet every day by oral route.     Cholecalciferol (VITAMIN D3 PO) Take 1 tablet by mouth daily in the afternoon.     fluocinonide cream (LIDEX) 0.05 %      fluticasone (CUTIVATE) 0.05 % cream Apply topically 2 (two) times daily. 30 g 2   hydrocortisone (ANUSOL-HC) 2.5 % rectal cream Place 1 Application rectally 3 (three) times daily. 30 g 1   linaclotide (LINZESS) 290 MCG CAPS capsule Take 1 capsule by mouth every other day.     meloxicam (MOBIC) 15 MG tablet Take 1 tablet by mouth as needed.     Multiple Vitamins-Minerals  (CENTRUM SILVER ULTRA WOMENS) TABS See admin instructions.     Multiple Vitamins-Minerals (ZINC PO) Take 1 tablet by mouth every other day.     omeprazole (PRILOSEC) 20 MG capsule Take 20 mg by mouth daily. Takes 30 minutes before breakfast     PROLIA 60 MG/ML SOSY injection Inject 60 mg into the skin every 6 (six) months.     Wheat Dextrin (BENEFIBER PO) Take 1-2 tablets by mouth as needed.     Zinc 50 MG TABS Take 1 capsule by mouth as needed.     magnesium 30 MG tablet Take 30 mg by mouth every other day. Alternating with Linzess     No current facility-administered medications for this visit.    Allergies  Allergen Reactions   Pneumococcal Vac Polyvalent     Swelling and redness at injection site   Latex Rash    Review of Systems:  neg     Physical Exam:    BP 120/80   Pulse 62   Ht '5\' 7"'$  (1.702 m)   Wt 150 lb 6 oz (68.2 kg)   BMI 23.55 kg/m  Wt Readings from Last 3 Encounters:  12/09/21 150 lb 6 oz (68.2 kg)  10/15/21 145 lb 6 oz (65.9 kg)  06/27/21 142 lb 4 oz (64.5 kg)   Constitutional:  Well-developed, in no acute distress. Psychiatric: Normal mood and affect. Behavior is normal. Cardiovascular: Normal rate, regular rhythm. No edema Pulmonary/chest: Effort normal and breath sounds normal. No wheezing, rales or rhonchi. Abdominal: Soft, nondistended. Nontender. Bowel sounds active throughout. There are no masses palpable. No hepatomegaly. Rectal: In presence of Nicole, small internal hemorrhoids.  Brown heme-negative stools. Neurological: Alert and oriented to person place and time. Skin: Skin is warm and dry. No rashes noted.      Carmell Austria, MD 12/09/2021, 1:37 PM  Cc: Bonnita Nasuti, MD

## 2021-12-18 ENCOUNTER — Telehealth: Payer: Self-pay | Admitting: Gastroenterology

## 2021-12-18 NOTE — Telephone Encounter (Signed)
Pt stated that she is continuing to have issues with her Hemorrhoids and started having some bleeding last night: Chart reviewed  Recent Office note on 12/09/2021 fwith Dr. Lyndel Safe states: Call if still with problems in 2 weeks. If still with problems, hoidal banding with Dr C.  Pt was scheduled on 12/23/2021 at 1:20 with Dr. Bryan Lemma for an hemorrhoid banding: Pt made aware  Pt verbalized understanding with all questions answered.

## 2021-12-18 NOTE — Telephone Encounter (Signed)
Patient called states she is having problems with her hemorrhoid and also states she had some bleeding last night. Requesting a call back on her home phone. Please call to advise.

## 2021-12-23 ENCOUNTER — Encounter: Payer: Self-pay | Admitting: Gastroenterology

## 2021-12-23 ENCOUNTER — Ambulatory Visit: Payer: Medicare PPO | Admitting: Gastroenterology

## 2021-12-23 VITALS — BP 120/78 | HR 67 | Ht 67.0 in | Wt 151.2 lb

## 2021-12-23 DIAGNOSIS — K649 Unspecified hemorrhoids: Secondary | ICD-10-CM | POA: Diagnosis not present

## 2021-12-23 NOTE — Patient Instructions (Addendum)
You have been scheduled for an appointment with Dr. Bryan Lemma on 02/10/22 at 1:20 pm  . Please arrive 10 minutes early for your appointment.   If you are age 72 or older, your body mass index should be between 23-30. Your Body mass index is 23.69 kg/m. If this is out of the aforementioned range listed, please consider follow up with your Primary Care Provider.  __________________________________________________________  The [place] [facility] providers would like to encourage you to use [facility] to communicate with providers for non-urgent requests or questions.  Due to long hold times on the telephone, sending your provider a message by [facility] may be a faster and more efficient way to get a response.  Please allow 48 business hours for a response.  Please remember that this is for non-urgent requests.   HEMORRHOID BANDING PROCEDURE    FOLLOW-UP CARE   The procedure you have had should have been relatively painless since the banding of the area involved does not have nerve endings and there is no pain sensation.  The rubber band cuts off the blood supply to the hemorrhoid and the band may fall off as soon as 48 hours after the banding (the band may occasionally be seen in the toilet bowl following a bowel movement). You may notice a temporary feeling of fullness in the rectum which should respond adequately to plain Tylenol or Motrin.  Following the banding, avoid strenuous exercise that evening and resume full activity the next day.  A sitz bath (soaking in a warm tub) or bidet is soothing, and can be useful for cleansing the area after bowel movements.     To avoid constipation, take two tablespoons of natural wheat bran, natural oat bran, flax, Benefiber or any over the counter fiber supplement and increase your water intake to 7-8 glasses daily.    Unless you have been prescribed anorectal medication, do not put anything inside your rectum for two weeks: No suppositories, enemas, fingers,  etc.  Occasionally, you may have more bleeding than usual after the banding procedure.  This is often from the untreated hemorrhoids rather than the treated one.  Don't be concerned if there is a tablespoon or so of blood.  If there is more blood than this, lie flat with your bottom higher than your head and apply an ice pack to the area. If the bleeding does not stop within a half an hour or if you feel faint, call our office at [phone] or go to the emergency room.  Problems are not common; however, if there is a substantial amount of bleeding, severe pain, chills, fever or difficulty passing urine (very rare) or other problems, you should call us at [phone] or report to the nearest emergency room.  Do not stay seated continuously for more than 2-3 hours for a day or two after the procedure.  Tighten your buttock muscles 10-15 times every two hours and take 10-15 deep breaths every 1-2 hours.  Do not spend more than a few minutes on the toilet if you cannot empty your bowel; instead re-visit the toilet at a later time.    Thank you for choosing me and [facility].  Vito Cirigliano, D.O.

## 2021-12-23 NOTE — Progress Notes (Signed)
Chief Complaint:    Symptomatic Internal Hemorrhoids; Hemorrhoid Band Ligation   HPI:     Patient is a 71 y.o. femalewith a history of symptomatic internal hemorrhoids, referred to me by Dr. Lyndel Pratt for consideration of hemorrhoid band ligation.  She has a history of symptomatic grade 2  hemorrhoids, unresponsive to maximal medical therapy, requesting rubber band ligation of symptomatic hemorrhoidal disease.  History of IBS-C, currently treated with Colace 1 tab daily with improvement. Occasional straining to have BM.   No change in medical or surgical history, medications, allergies, social history since last appointment with Dr. Lyndel Pratt on 12/09/2021.  Past GI work-up: Colon 04/30/2020 - One 6 mm polyp in the cecum, removed with a cold snare. Resected and retrieved. - Non-bleeding internal hemorrhoids. - Otherwise normal colonoscopy to TI. The colon was highly redundant. - Rpt in 5 yrs d/t FH   CT AP with contrast 09/2020 Large amount of stool seen throughout the colon. No acute abnormality seen in the abdomen or pelvis. Aortic Atherosclerosis (ICD10-I70.0).   CT Abdo/pelvis with contrast 02/13/2020 1. No acute intra-abdominal or pelvic pathology. No bowel obstruction. Normal appendix. 2. A 15 mm left adnexal cyst similar to prior CT. Further evaluation with ultrasound recommended. 3. A 3 cm indeterminate right hepatic mass, likely a hemangioma. MRI may provide better characterization.  She refused MRI. 4. Aortic Atherosclerosis (ICD10-I70.0).   EGD-we do not have the actual report.  She does bring in pictures 06/25/2015: Small hiatal hernia, erosive esophagitis.  We do have biopsies which were negative for Barrett's.   Colonoscopy 06/25/2015: We do not have the actual report.  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  Also had prominent ileocecal valve with negative biopsies.  Advised to repeat in 5 years.   Review of systems:     No chest pain, no SOB, no fevers, no  urinary sx   Past Medical History:  Diagnosis Date   Allergy    Arthritis    GERD (gastroesophageal reflux disease)     Patient's surgical history, family medical history, social history, medications and allergies were all reviewed in Epic    Current Outpatient Medications  Medication Sig Dispense Refill   aspirin 81 MG chewable tablet 1 tablet     Calcium Carbonate+Vitamin D 600-200 MG-UNIT TABS 1 tablet with a meal     cetirizine (ZYRTEC) 10 MG tablet Zyrtec 10 mg tablet  Take 1 tablet every day by oral route.     Cholecalciferol (VITAMIN D3 PO) Take 1 tablet by mouth daily in the afternoon.     fluocinonide cream (LIDEX) 0.05 %      hydrocortisone (ANUSOL-HC) 2.5 % rectal cream Place 1 Application rectally 3 (three) times daily. 30 g 1   linaclotide (LINZESS) 290 MCG CAPS capsule Take 1 capsule by mouth every other day.     meloxicam (MOBIC) 15 MG tablet Take 1 tablet by mouth as needed.     Multiple Vitamins-Minerals (CENTRUM SILVER ULTRA WOMENS) TABS See admin instructions.     Multiple Vitamins-Minerals (ZINC PO) Take 1 tablet by mouth every other day.     omeprazole (PRILOSEC) 20 MG capsule Take 20 mg by mouth daily. Takes 30 minutes before breakfast     PROLIA 60 MG/ML SOSY injection Inject 60 mg into the skin every 6 (six) months.     Wheat Dextrin (BENEFIBER PO) Take 1-2 tablets by mouth as needed.     Zinc 50 MG TABS Take 1 capsule by mouth as  needed.     No current facility-administered medications for this visit.    Physical Exam:     Ht '5\' 7"'$  (1.702 m)   Wt 151 lb 4 oz (68.6 kg)   BMI 23.69 kg/m   GENERAL:  Pleasant female in NAD PSYCH: : Cooperative, normal affect NEURO: Alert and oriented x 3, no focal neurologic deficits Rectal exam: Sensation intact and preserved anal wink.  Grade 2 hemorrhoids noted in all positions on anoscopy.  No external anal fissures noted. Normal sphincter tone. No palpable mass. No blood on the exam glove. (Chaperone: [name],  CMA).   IMPRESSION and PLAN:    #1.  Symptomatic internal hemorrhoids: PROCEDURE NOTE: The patient presents with symptomatic grade 2  hemorrhoids, unresponsive to maximal medical therapy, requesting rubber band ligation of symptomatic hemorrhoidal disease.  All risks, benefits and alternative forms of therapy were described and informed consent was obtained.  In the Left Lateral Decubitus position, anoscopic examination revealed grade 2 hemorrhoids in all position(s).  The anorectum was pre-medicated with RectiCare. The decision was made to band the LL internal hemorrhoid, and the [place] was used to perform band ligation without complication.  Digital anorectal examination was then performed to assure proper positioning of the band, and to adjust the banded tissue as required.  The patient was discharged home without pain or other issues.  Dietary and behavioral recommendations were given and along with follow-up instructions.     The following adjunctive treatments were recommended:  -Resume high-fiber diet with fiber supplement (i.e. Citrucel or Benefiber) with goal for soft stools without straining to have a BM. -Resume adequate fluid intake.  The patient will return in 4 for follow-up and possible additional banding as required. No complications were encountered and the patient tolerated the procedure well.      #2.  IBS-C - Resume current therapy.       Audrey Pratt ,DO, FACG 12/23/2021, 1:26 PM

## 2022-02-10 ENCOUNTER — Ambulatory Visit (INDEPENDENT_AMBULATORY_CARE_PROVIDER_SITE_OTHER): Payer: Medicare PPO | Admitting: Gastroenterology

## 2022-02-10 ENCOUNTER — Encounter: Payer: Self-pay | Admitting: Gastroenterology

## 2022-02-10 VITALS — BP 120/76 | HR 74 | Ht 67.0 in | Wt 150.4 lb

## 2022-02-10 DIAGNOSIS — K648 Other hemorrhoids: Secondary | ICD-10-CM | POA: Diagnosis not present

## 2022-02-10 NOTE — Patient Instructions (Signed)
You have been scheduled for an appointment with Dr. Bryan Lemma on 03/27/22 at 1:20 pm. Please arrive 10 minutes early for your appointment.   HEMORRHOID BANDING PROCEDURE    FOLLOW-UP CARE   The procedure you have had should have been relatively painless since the banding of the area involved does not have nerve endings and there is no pain sensation.  The rubber band cuts off the blood supply to the hemorrhoid and the band may fall off as soon as 48 hours after the banding (the band may occasionally be seen in the toilet bowl following a bowel movement). You may notice a temporary feeling of fullness in the rectum which should respond adequately to plain Tylenol or Motrin.  Following the banding, avoid strenuous exercise that evening and resume full activity the next day.  A sitz bath (soaking in a warm tub) or bidet is soothing, and can be useful for cleansing the area after bowel movements.     To avoid constipation, take two tablespoons of natural wheat bran, natural oat bran, flax, Benefiber or any over the counter fiber supplement and increase your water intake to 7-8 glasses daily.    Unless you have been prescribed anorectal medication, do not put anything inside your rectum for two weeks: No suppositories, enemas, fingers, etc.  Occasionally, you may have more bleeding than usual after the banding procedure.  This is often from the untreated hemorrhoids rather than the treated one.  Don't be concerned if there is a tablespoon or so of blood.  If there is more blood than this, lie flat with your bottom higher than your head and apply an ice pack to the area. If the bleeding does not stop within a half an hour or if you feel faint, call our office at [phone] or go to the emergency room.  Problems are not common; however, if there is a substantial amount of bleeding, severe pain, chills, fever or difficulty passing urine (very rare) or other problems, you should call us at [phone]  [phone] or report to the nearest emergency room.  Do not stay seated continuously for more than 2-3 hours for a day or two after the procedure.  Tighten your buttock muscles 10-15 times every two hours and take 10-15 deep breaths every 1-2 hours.  Do not spend more than a few minutes on the toilet if you cannot empty your bowel; instead re-visit the toilet at a later time.    Thank you for choosing me and [facility].  Vito Cirigliano, D.O.

## 2022-02-10 NOTE — Progress Notes (Signed)
Chief Complaint:    Symptomatic Internal Hemorrhoids; Hemorrhoid Band Ligation  GI History: 71 y.o. femalewith a history of symptomatic internal hemorrhoids, referred to me by Dr. Lyndel Safe for consideration of hemorrhoid band ligation.  She has a history of symptomatic grade 2  hemorrhoids, unresponsive to maximal medical therapy, requesting rubber band ligation of symptomatic hemorrhoidal disease.   History of IBS-C, currently treated with Colace 1 tab daily with improvement. Occasional straining to have BM.   No change in medical or surgical history, medications, allergies, social history since last appointment with Dr. Lyndel Safe on 12/09/2021.   Past GI work-up: Colon 04/30/2020 - One 6 mm polyp in the cecum, removed with a cold snare. Resected and retrieved. - Non-bleeding internal hemorrhoids. - Otherwise normal colonoscopy to TI. The colon was highly redundant. - Rpt in 5 yrs d/t FH   CT AP with contrast 09/2020 Large amount of stool seen throughout the colon. No acute abnormality seen in the abdomen or pelvis. Aortic Atherosclerosis (ICD10-I70.0).   CT Abdo/pelvis with contrast 02/13/2020 1. No acute intra-abdominal or pelvic pathology. No bowel obstruction. Normal appendix. 2. A 15 mm left adnexal cyst similar to prior CT. Further evaluation with ultrasound recommended. 3. A 3 cm indeterminate right hepatic mass, likely a hemangioma. MRI may provide better characterization.  She refused MRI. 4. Aortic Atherosclerosis (ICD10-I70.0).   EGD-we do not have the actual report.  She does bring in pictures 06/25/2015: Small hiatal hernia, erosive esophagitis.  We do have biopsies which were negative for Barrett's.   Colonoscopy 06/25/2015: We do not have the actual report.  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  Also had prominent ileocecal valve with negative biopsies.  Advised to repeat in 5 years.  HPI:     Patient is a 71 y.o. femalewith a history of symptomatic internal  hemorrhoids presenting [facility] for follow-up and ongoing treatment. The patient presents with symptomatic grade 2 hemorrhoids, unresponsive to maximal medical therapy, requesting rubber band ligation of symptomatic hemorrhoidal disease.  -12/23/2021: Banding of LL hemorrhoid:  No change in medical or surgical history, medications, allergies, social history since last appointment with me.   Review of systems:     No chest pain, no SOB, no fevers, no urinary sx   Past Medical History:  Diagnosis Date   Allergy    Arthritis    GERD (gastroesophageal reflux disease)     Patient's surgical history, family medical history, social history, medications and allergies were all reviewed in Epic    Current Outpatient Medications  Medication Sig Dispense Refill   aspirin 81 MG chewable tablet 1 tablet     Calcium Carbonate+Vitamin D 600-200 MG-UNIT TABS 1 tablet with a meal     cetirizine (ZYRTEC) 10 MG tablet Zyrtec 10 mg tablet  Take 1 tablet every day by oral route.     Cholecalciferol (VITAMIN D3 PO) Take 1 tablet by mouth daily in the afternoon.     fluocinonide cream (LIDEX) 0.05 %      hydrocortisone (ANUSOL-HC) 2.5 % rectal cream Place 1 Application rectally 3 (three) times daily. 30 g 1   linaclotide (LINZESS) 290 MCG CAPS capsule Take 1 capsule by mouth every other day.     meloxicam (MOBIC) 15 MG tablet Take 1 tablet by mouth as needed.     Multiple Vitamins-Minerals (CENTRUM SILVER ULTRA WOMENS) TABS See admin instructions.     Multiple Vitamins-Minerals (ZINC PO) Take 1 tablet by mouth every other day.  omeprazole (PRILOSEC) 20 MG capsule Take 20 mg by mouth daily. Takes 30 minutes before breakfast     PROLIA 60 MG/ML SOSY injection Inject 60 mg into the skin every 6 (six) months.     Wheat Dextrin (BENEFIBER PO) Take 1-2 tablets by mouth as needed.     Zinc 50 MG TABS Take 1 capsule by mouth as needed.     No current facility-administered medications  for this visit.    Physical Exam:     BP 120/76 (BP Location: Right Arm, Patient Position: Sitting, Cuff Size: Normal)   Pulse 74   Ht '5\' 7"'$  (1.702 m)   Wt 150 lb 6 oz (68.2 kg)   SpO2 98%   BMI 23.55 kg/m   GENERAL:  Pleasant female in NAD PSYCH: : Cooperative, normal affect Rectal exam: Sensation intact and preserved anal wink.  External skin tags.  Grade 2 hemorrhoids noted in RP and RA positions on exam.  No external anal fissures noted. Normal sphincter tone. No palpable mass. No blood on the exam glove. (Chaperone: [name], CMA).   IMPRESSION and PLAN:    #1.  Symptomatic internal hemorrhoids: PROCEDURE NOTE: The patient presents with symptomatic grade 2 hemorrhoids, unresponsive to maximal medical therapy, requesting rubber band ligation of symptomatic hemorrhoidal disease.  All risks, benefits and alternative forms of therapy were described and informed consent was obtained.  In the Left Lateral Decubitus position, anoscopic examination revealed grade 2 hemorrhoids in the RP and RA position(s).  The anorectum was pre-medicated with RectiCare. The decision was made to band the RA internal hemorrhoid, and the [place] was used to perform band ligation without complication.  Digital anorectal examination was then performed to assure proper positioning of the band, and to adjust the banded tissue as required.  The patient was discharged home without pain or other issues.  Dietary and behavioral recommendations were given and along with follow-up instructions.     The following adjunctive treatments were recommended:  -Resume high-fiber diet with fiber supplement (i.e. Citrucel or Benefiber) with goal for soft stools without straining to have a BM. -Resume adequate fluid intake.  The patient will return in 4 for follow-up and possible additional banding as required. No complications were encountered and the patient tolerated the procedure well.      #2.   IBS-C - Resume current therapy.       Lavena Bullion ,DO, FACG 02/10/2022, 1:39 PM

## 2022-02-17 ENCOUNTER — Telehealth: Payer: Self-pay | Admitting: Gastroenterology

## 2022-02-17 NOTE — Telephone Encounter (Signed)
Left message for pt to call back  °

## 2022-02-17 NOTE — Telephone Encounter (Signed)
Inbound call from patient requesting a call back to discuss the banding procedure that she had with Dr. Bryan Lemma on 11/28. Please advise.

## 2022-02-17 NOTE — Telephone Encounter (Signed)
Inbound call from patient returning call. 

## 2022-02-17 NOTE — Telephone Encounter (Signed)
Patient is calling requesting to speak to a nurse. Please advise

## 2022-02-18 NOTE — Telephone Encounter (Signed)
Pt states she has had a little blood in the tissue since her hem banding. Discussed with pt that this is not uncommon and it should stop. She will call back if she has any other issues.

## 2022-03-03 ENCOUNTER — Other Ambulatory Visit: Payer: Self-pay | Admitting: Internal Medicine

## 2022-03-03 DIAGNOSIS — Z1231 Encounter for screening mammogram for malignant neoplasm of breast: Secondary | ICD-10-CM

## 2022-03-17 NOTE — Telephone Encounter (Signed)
Patient called states she is having some rectal bleeding along with sone abdominal pain seeking advise.

## 2022-03-17 NOTE — Telephone Encounter (Signed)
Pt reports that she started having intermittent left lower abdominal pain a few days ago. Pt also reports that she started having loose stool yesterday and had 5 bowel movements yesterday. Pain is worse after using the bathroom. Laying on a heating pad helps the pain. Pt also has rectal bleeding with bowel movements. Pt scheduled for an appointment with Dr. Lyndel Safe on Thursday 03/19/22 at 11:20 am.

## 2022-03-19 ENCOUNTER — Ambulatory Visit: Payer: Medicare PPO | Admitting: Gastroenterology

## 2022-03-19 ENCOUNTER — Encounter: Payer: Self-pay | Admitting: Gastroenterology

## 2022-03-19 VITALS — BP 130/80 | HR 78 | Ht 67.0 in | Wt 150.0 lb

## 2022-03-19 DIAGNOSIS — K625 Hemorrhage of anus and rectum: Secondary | ICD-10-CM

## 2022-03-19 DIAGNOSIS — K649 Unspecified hemorrhoids: Secondary | ICD-10-CM | POA: Diagnosis not present

## 2022-03-19 DIAGNOSIS — K581 Irritable bowel syndrome with constipation: Secondary | ICD-10-CM

## 2022-03-19 MED ORDER — FLUTICASONE PROPIONATE 0.05 % EX CREA: TOPICAL_CREAM | Freq: Two times a day (BID) | CUTANEOUS | 2 refills | Status: AC

## 2022-03-19 NOTE — Patient Instructions (Addendum)
_______________________________________________________  If you are age 73 or older, your body mass index should be between 23-30. Your Body mass index is 23.49 kg/m. If this is out of the aforementioned range listed, please consider follow up with your Primary Care Provider.  If you are age 48 or younger, your body mass index should be between 19-25. Your Body mass index is 23.49 kg/m. If this is out of the aformentioned range listed, please consider follow up with your Primary Care Provider.   ________________________________________________________  The [place] [facility] providers would like to encourage you to use [facility] to communicate with providers for non-urgent requests or questions.  Due to long hold times on the telephone, sending your provider a message by [facility] may be a faster and more efficient way to get a response.  Please allow 48 business hours for a response.  Please remember that this is for non-urgent requests.  _______________________________________________________   Please purchase the following medications over the counter and take as directed: Benefiber 1 pack a day Colace 1 tablet daily in 8oz of water  Avoid NSAID's  Keep upcoming appointment with Dr Bryan Lemma.  Thank you,  Dr. Jackquline Denmark

## 2022-03-19 NOTE — Progress Notes (Signed)
Chief Complaint: FU  Referring Provider:  Bonnita Nasuti, MD      ASSESSMENT AND PLAN;   #1. Rectal bleeding d/t hoids s/p banding 01/2022. Nl CBC. Neg colon 04/2020.  #2. IBS-C.   #3. FH colon Ca (sis at age 72).    #4. Abn CT 09/2020 showing liver lesion-likely hemangioma.  Radiology had advised MRI.  Patient wanted to hold off.  Plan:  -Resume Benefiber 1 pack QD with 8 oz water. -Resume Colace 1 QD with 8 oz of water. -fluticasone cream 0.05% generic 30g 1 bid PR x 10 days, 2 refills -Avoid NSAIDs. -Proceed with hoid banding next week. -Rpt colon Feb 2027.    HPI:    Audrey Pratt is a 72 y.o. female   For FU  Better overall. S/P Hoid banding x 1, scheduled for next one- next week Rectal bleeding is much better.  Only when she has to strain. No incontinence.  She stopped taking Benefiber and Colace which has resulted in constipation and some rectal discomfort.  Neg colonoscopy February 2022 as below.  Denies having any upper GI symptoms.  No nonsteroidals.  No abdominal pain.   Past GI work-up: Colon 04/30/2020 - One 6 mm polyp in the cecum, removed with a cold snare. Resected and retrieved. - Non-bleeding internal hemorrhoids. - Otherwise normal colonoscopy to TI. The colon was highly redundant. - Rpt in 5 yrs d/t FH   CT AP with contrast 09/2020 Large amount of stool seen throughout the colon. No acute abnormality seen in the abdomen or pelvis. Aortic Atherosclerosis (ICD10-I70.0).   CT Abdo/pelvis with contrast 02/13/2020 1. No acute intra-abdominal or pelvic pathology. No bowel obstruction. Normal appendix. 2. A 15 mm left adnexal cyst similar to prior CT. Further evaluation with ultrasound recommended. 3. A 3 cm indeterminate right hepatic mass, likely a hemangioma. MRI may provide better characterization.  She refused MRI. 4. Aortic Atherosclerosis (ICD10-I70.0).   EGD-we do not have the actual report.  She does bring in pictures 06/25/2015:  Small hiatal hernia, erosive esophagitis.  We do have biopsies which were negative for Barrett's.   Colonoscopy 06/25/2015: We do not have the actual report.  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  Also had prominent ileocecal valve with negative biopsies.  Advised to repeat in 5 years.  SH-Melvin is a brother-in-law.  Daughter is a Marine scientist.  Past Medical History:  Diagnosis Date   Allergy    Arthritis    GERD (gastroesophageal reflux disease)     Past Surgical History:  Procedure Laterality Date   BREAST LUMPECTOMY Right    COLONOSCOPY  06/25/2015   every 5 years   HAND TENDON SURGERY Right    PARTIAL HYSTERECTOMY     ROTATOR CUFF REPAIR Right     Family History  Problem Relation Age of Onset   Heart disease Mother    Prostate cancer Father    Heart disease Father    Colon cancer Sister    Throat cancer Brother    Prostate cancer Brother    Stomach cancer Maternal Grandmother    Rectal cancer Neg Hx    Esophageal cancer Neg Hx     Social History   Tobacco Use   Smoking status: Never   Smokeless tobacco: Never  Vaping Use   Vaping Use: Never used  Substance Use Topics   Alcohol use: Never   Drug use: Never    Current Outpatient Medications  Medication Sig Dispense Refill  aspirin 81 MG chewable tablet 1 tablet     Calcium Carbonate+Vitamin D 600-200 MG-UNIT TABS 1 tablet with a meal     cetirizine (ZYRTEC) 10 MG tablet Zyrtec 10 mg tablet  Take 1 tablet every day by oral route.     Cholecalciferol (VITAMIN D3 PO) Take 1 tablet by mouth daily in the afternoon.     fluocinonide cream (LIDEX) 0.05 %      hydrocortisone (ANUSOL-HC) 2.5 % rectal cream Place 1 Application rectally 3 (three) times daily. 30 g 1   linaclotide (LINZESS) 290 MCG CAPS capsule Take 1 capsule by mouth every other day.     meloxicam (MOBIC) 15 MG tablet Take 1 tablet by mouth as needed.     Multiple Vitamins-Minerals (CENTRUM SILVER ULTRA WOMENS) TABS See admin instructions.      omeprazole (PRILOSEC) 20 MG capsule Take 20 mg by mouth daily. Takes 30 minutes before breakfast     PROLIA 60 MG/ML SOSY injection Inject 60 mg into the skin every 6 (six) months.     Wheat Dextrin (BENEFIBER PO) Take 1-2 tablets by mouth as needed.     Zinc 50 MG TABS Take 1 capsule by mouth as needed.     Multiple Vitamins-Minerals (ZINC PO) Take 1 tablet by mouth every other day.     No current facility-administered medications for this visit.    Allergies  Allergen Reactions   Pneumococcal Vac Polyvalent     Swelling and redness at injection site   Latex Rash    Review of Systems:  neg     Physical Exam:    BP 130/80   Pulse 78   Ht '5\' 7"'$  (1.702 m)   Wt 150 lb (68 kg)   SpO2 98%   BMI 23.49 kg/m  Wt Readings from Last 3 Encounters:  03/19/22 150 lb (68 kg)  02/10/22 150 lb 6 oz (68.2 kg)  12/23/21 151 lb 4 oz (68.6 kg)   Constitutional:  Well-developed, in no acute distress. Psychiatric: Normal mood and affect. Behavior is normal. Cardiovascular: Normal rate, regular rhythm. No edema Pulmonary/chest: Effort normal and breath sounds normal. No wheezing, rales or rhonchi. Abdominal: Soft, nondistended. Nontender. Bowel sounds active throughout. There are no masses palpable. No hepatomegaly. Rectal: small internal hemorrhoids.  Brown heme-negative stools.  In presence of [place]oke Neurological: Alert and oriented to person place and time. Skin: Skin is warm and dry. No rashes noted.      Carmell Austria, MD 03/19/2022, 11:38 AM  Cc: Bonnita Nasuti, MD

## 2022-03-24 ENCOUNTER — Ambulatory Visit: Payer: Medicare PPO

## 2022-03-27 ENCOUNTER — Ambulatory Visit: Payer: Medicare PPO | Admitting: Gastroenterology

## 2022-03-27 ENCOUNTER — Encounter: Payer: Self-pay | Admitting: Gastroenterology

## 2022-03-27 ENCOUNTER — Ambulatory Visit
Admission: RE | Admit: 2022-03-27 | Discharge: 2022-03-27 | Disposition: A | Discharge status: Home / Self Care | Payer: Medicare PPO | Source: Ambulatory Visit | Attending: Internal Medicine | Admitting: Internal Medicine

## 2022-03-27 VITALS — BP 112/78 | HR 83 | Ht 67.0 in | Wt 149.0 lb

## 2022-03-27 DIAGNOSIS — Z1231 Encounter for screening mammogram for malignant neoplasm of breast: Secondary | ICD-10-CM

## 2022-03-27 DIAGNOSIS — K649 Unspecified hemorrhoids: Secondary | ICD-10-CM | POA: Diagnosis not present

## 2022-03-27 NOTE — Patient Instructions (Addendum)
HEMORRHOID BANDING PROCEDURE    FOLLOW-UP CARE   The procedure you have had should have been relatively painless since the banding of the area involved does not have nerve endings and there is no pain sensation.  The rubber band cuts off the blood supply to the hemorrhoid and the band may fall off as soon as 48 hours after the banding (the band may occasionally be seen in the toilet bowl following a bowel movement). You may notice a temporary feeling of fullness in the rectum which should respond adequately to plain Tylenol or Motrin.  Following the banding, avoid strenuous exercise that evening and resume full activity the next day.  A sitz bath (soaking in a warm tub) or bidet is soothing, and can be useful for cleansing the area after bowel movements.     To avoid constipation, take two tablespoons of natural wheat bran, natural oat bran, flax, Benefiber or any over the counter fiber supplement and increase your water intake to 7-8 glasses daily.    Unless you have been prescribed anorectal medication, do not put anything inside your rectum for two weeks: No suppositories, enemas, fingers, etc.  Occasionally, you may have more bleeding than usual after the banding procedure.  This is often from the untreated hemorrhoids rather than the treated one.  Don't be concerned if there is a tablespoon or so of blood.  If there is more blood than this, lie flat with your bottom higher than your head and apply an ice pack to the area. If the bleeding does not stop within a half an hour or if you feel faint, call our office at [phone] or go to the emergency room.  Problems are not common; however, if there is a substantial amount of bleeding, severe pain, chills, fever or difficulty passing urine (very rare) or other problems, you should call us at [phone] or report to the nearest emergency room.  Do not stay seated continuously for more than 2-3 hours for a day or two after the  procedure.  Tighten your buttock muscles 10-15 times every two hours and take 10-15 deep breaths every 1-2 hours.  Do not spend more than a few minutes on the toilet if you cannot empty your bowel; instead re-visit the toilet at a later time.     _______________________________________________________ Follow up as needed.   If your blood pressure at your visit was 140/90 or greater, please contact your primary care physician to follow up on this.  _______________________________________________________  If you are age 73 or older, your body mass index should be between 23-30. Your Body mass index is 23.34 kg/m. If this is out of the aforementioned range listed, please consider follow up with your Primary Care Provider.  ________________________________________________________  The [place] [facility] providers would like to encourage you to use [facility] to communicate with providers for non-urgent requests or questions.  Due to long hold times on the telephone, sending your provider a message by [facility] may be a faster and more efficient way to get a response.  Please allow 48 business hours for a response.  Please remember that this is for non-urgent requests.  _______________________________________________________   Due to recent changes in healthcare laws, you may see the results of your imaging and laboratory studies on MyChart before your provider has had a chance to review them.  We understand that in some cases there may be results that are confusing or concerning to you. Not all laboratory results come back  in the same time frame and the provider may be waiting for multiple results in order to interpret others.  Please give [place] 48 hours in order for your provider to thoroughly review all the results before contacting the office for clarification of your results.    Thank you for choosing me and [facility].  Vito Cirigliano, D.O.

## 2022-03-27 NOTE — Progress Notes (Signed)
Chief Complaint:    Symptomatic Internal Hemorrhoids; Hemorrhoid Band Ligation  GI History: 72 y.o. femalewith a history of symptomatic internal hemorrhoids, referred to me by Dr. Lyndel Safe for consideration of hemorrhoid band ligation.  She has a history of symptomatic grade 2  hemorrhoids, unresponsive to maximal medical therapy, requesting rubber band ligation of symptomatic hemorrhoidal disease. - 12/23/2021: Banding of LL hemorrhoid - 02/10/2022: Banding of RA hemorrhoid   History of IBS-C, currently treated with Colace 1 tab daily and Benefiber with improvement. Occasional straining to have BM.     Past GI work-up: Colon 04/30/2020 - One 6 mm polyp in the cecum, removed with a cold snare. Resected and retrieved. - Non-bleeding internal hemorrhoids. - Otherwise normal colonoscopy to TI. The colon was highly redundant. - Rpt in 5 yrs d/t FH   CT AP with contrast 09/2020 Large amount of stool seen throughout the colon. No acute abnormality seen in the abdomen or pelvis. Aortic Atherosclerosis (ICD10-I70.0).   CT Abdo/pelvis with contrast 02/13/2020 1. No acute intra-abdominal or pelvic pathology. No bowel obstruction. Normal appendix. 2. A 15 mm left adnexal cyst similar to prior CT. Further evaluation with ultrasound recommended. 3. A 3 cm indeterminate right hepatic mass, likely a hemangioma. MRI may provide better characterization.  She refused MRI. 4. Aortic Atherosclerosis (ICD10-I70.0).   EGD-we do not have the actual report.  She does bring in pictures 06/25/2015: Small hiatal hernia, erosive esophagitis.  We do have biopsies which were negative for Barrett's.   Colonoscopy 06/25/2015: We do not have the actual report.  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  Also had prominent ileocecal valve with negative biopsies.  Advised to repeat in 5 years.  HPI:     Patient is a 72 y.o. femalewith a history of symptomatic internal hemorrhoids presenting to the  [facility] for follow-up and ongoing treatment. The patient presents with symptomatic grade 2 hemorrhoids, unresponsive to maximal medical therapy, requesting rubber band ligation of symptomatic hemorrhoidal disease.  Doing well after each of the first 2 hemorrhoid banding's.  Did have follow-up with Dr. Lyndel Safe last week for continued management of IBS-C.  Presents today for continued banding series.  No change in medical or surgical history, medications, allergies, social history since last appointment with me.   Review of systems:     No chest pain, no SOB, no fevers, no urinary sx   Past Medical History:  Diagnosis Date   Allergy    Arthritis    GERD (gastroesophageal reflux disease)     Patient's surgical history, family medical history, social history, medications and allergies were all reviewed in Epic    Current Outpatient Medications  Medication Sig Dispense Refill   aspirin 81 MG chewable tablet 1 tablet     Calcium Carbonate+Vitamin D 600-200 MG-UNIT TABS 1 tablet with a meal     cetirizine (ZYRTEC) 10 MG tablet Zyrtec 10 mg tablet  Take 1 tablet every day by oral route.     Cholecalciferol (VITAMIN D3 PO) Take 1 tablet by mouth daily in the afternoon.     docusate (COLACE) 50 MG/5ML liquid Take by mouth daily.     linaclotide (LINZESS) 290 MCG CAPS capsule Take 1 capsule by mouth every other day.     Multiple Vitamins-Minerals (CENTRUM SILVER ULTRA WOMENS) TABS See admin instructions.     omeprazole (PRILOSEC) 20 MG capsule Take 20 mg by mouth daily. Takes 30 minutes before breakfast     PROLIA 60 MG/ML SOSY  injection Inject 60 mg into the skin every 6 (six) months.     fluocinonide cream (LIDEX) 0.05 %  (Patient not taking: Reported on 03/27/2022)     fluticasone (CUTIVATE) 0.05 % cream Apply topically 2 (two) times daily. For 10 days and then use as needed (Patient not taking: Reported on 03/27/2022) 30 g 2   hydrocortisone (ANUSOL-HC) 2.5 % rectal cream Place  1 Application rectally 3 (three) times daily. (Patient not taking: Reported on 03/27/2022) 30 g 1   meloxicam (MOBIC) 15 MG tablet Take 1 tablet by mouth as needed. (Patient not taking: Reported on 03/27/2022)     Multiple Vitamins-Minerals (ZINC PO) Take 1 tablet by mouth every other day.     Wheat Dextrin (BENEFIBER PO) Take 1-2 tablets by mouth as needed. (Patient not taking: Reported on 03/27/2022)     Zinc 50 MG TABS Take 1 capsule by mouth as needed. (Patient not taking: Reported on 03/27/2022)     No current facility-administered medications for this visit.    Physical Exam:     BP 112/78   Pulse 83   Ht '5\' 7"'$  (1.702 m)   Wt 149 lb (67.6 kg)   BMI 23.34 kg/m   GENERAL:  Pleasant female in NAD PSYCH: : Cooperative, normal affect NEURO: Alert and oriented x 3, no focal neurologic deficits Rectal exam: Sensation intact and preserved anal wink.  External skin tags.  Grade 2 hemorrhoids noted in RP position on exam.  No external anal fissures noted. Normal sphincter tone. No palpable mass. No blood on the exam glove. (Chaperone: [name], CMA).   IMPRESSION and PLAN:    #1.  Symptomatic internal hemorrhoids: PROCEDURE NOTE: The patient presents with symptomatic grade 2 hemorrhoids, unresponsive to maximal medical therapy, requesting rubber band ligation of symptomatic hemorrhoidal disease.  All risks, benefits and alternative forms of therapy were described and informed consent was obtained.  In the Left Lateral Decubitus position, anoscopic examination revealed grade 2 hemorrhoids in the RP position(s).  The anorectum was pre-medicated with RectiCare. The decision was made to band the RP internal hemorrhoid, and the [place] was used to perform band ligation without complication.  Digital anorectal examination was then performed to assure proper positioning of the band, and to adjust the banded tissue as required.  The patient was discharged home without pain or other  issues.  Dietary and behavioral recommendations were given and along with follow-up instructions.     The following adjunctive treatments were recommended:  -Resume high-fiber diet with fiber supplement (i.e. Citrucel or Benefiber) with goal for soft stools without straining to have a BM. - Resume Colace -Resume adequate fluid intake.  The patient will follow-up with me as needed if recurrence of hemorrhoidal symptoms that require repeat hemorrhoid banding.  To otherwise continue following with Dr. Lyndel Safe      #2.  IBS-C - Continue current medical management - Continue follow-up with Dr. Anthonette Legato ,DO, [facility] 03/27/2022, 1:26 PM

## 2022-03-31 ENCOUNTER — Other Ambulatory Visit: Payer: Self-pay | Admitting: Internal Medicine

## 2022-03-31 DIAGNOSIS — R928 Other abnormal and inconclusive findings on diagnostic imaging of breast: Secondary | ICD-10-CM

## 2022-04-02 ENCOUNTER — Ambulatory Visit
Admission: RE | Admit: 2022-04-02 | Discharge: 2022-04-02 | Disposition: A | Discharge status: Home / Self Care | Payer: Medicare PPO | Source: Ambulatory Visit | Attending: Internal Medicine | Admitting: Internal Medicine

## 2022-04-02 ENCOUNTER — Other Ambulatory Visit: Payer: Self-pay | Admitting: Internal Medicine

## 2022-04-02 DIAGNOSIS — R928 Other abnormal and inconclusive findings on diagnostic imaging of breast: Secondary | ICD-10-CM

## 2022-04-02 DIAGNOSIS — R921 Mammographic calcification found on diagnostic imaging of breast: Secondary | ICD-10-CM

## 2022-04-07 ENCOUNTER — Telehealth: Payer: Self-pay | Admitting: Gastroenterology

## 2022-04-07 NOTE — Telephone Encounter (Signed)
Patient is calling states she is having blood in her stool again and is looking for some advice. Please advise

## 2022-04-07 NOTE — Telephone Encounter (Signed)
Spoke with pt and pt stated that she had a small amount of blood in her stool. Pt reports she had been constipated and blood resolved when stool became looser. Let pt know she should continue taking colace and benefiber and make sure that she is drinking plenty of water. Pt states she has rectal pain on and off but is doing sitz baths daily. Pt says she used the fluticasone two times and then stopped. Let pt know she can use it twice a day for 10 days. Advised pt to use wet wipes instead of toilet paper and pt verbalized understanding. Does pt need to be scheduled for another banding? Pt also states that she was concerned and wanted to make sure bleeding wasn't something else. Pt said she has had some back pain too.

## 2022-04-07 NOTE — Telephone Encounter (Signed)
Agree with conservative measures as you have recommended.  Hold off on scheduling for repeat banding as this was just done last week and would prefer to monitor for efficacy.

## 2022-04-08 NOTE — Telephone Encounter (Signed)
Spoke with pt and let pt know Dr. Vivia Ewing message. Pt verbalized understanding.

## 2022-05-13 ENCOUNTER — Telehealth: Payer: Self-pay | Admitting: Gastroenterology

## 2022-05-13 NOTE — Telephone Encounter (Signed)
Returned call to patient. I left patient a detailed message letting her know that the only available appointment with Dr. Lyndel Safe would be Tuesday, 05/19/22 at 8:30 am. I informed patient that I have scheduled her in this appt and she should call back if this appt does not work for her. I advised pt that I will send this information to her MyChart as well.

## 2022-05-13 NOTE — Telephone Encounter (Signed)
Inbound call from patient stating that she wanted to make an appointment with Dr. Lyndel Safe because she is still having issues with discomfort. I advised patient that his next available was not until 3/19. Patient stated she does not live in [place] anymore and she will only be in town from Saturday to next Tuesday or Wednesday. Patient is requesting a call back to discuss if there is anyway she can be worked in. Please advise.

## 2022-05-19 ENCOUNTER — Encounter: Payer: Self-pay | Admitting: Gastroenterology

## 2022-05-19 ENCOUNTER — Ambulatory Visit: Payer: Medicare PPO | Admitting: Gastroenterology

## 2022-05-19 VITALS — BP 116/68 | HR 65 | Ht 67.0 in | Wt 149.4 lb

## 2022-05-19 DIAGNOSIS — Z800 Family history of malignant neoplasm of digestive organs: Secondary | ICD-10-CM

## 2022-05-19 DIAGNOSIS — K625 Hemorrhage of anus and rectum: Secondary | ICD-10-CM

## 2022-05-19 DIAGNOSIS — K581 Irritable bowel syndrome with constipation: Secondary | ICD-10-CM | POA: Diagnosis not present

## 2022-05-19 DIAGNOSIS — K649 Unspecified hemorrhoids: Secondary | ICD-10-CM

## 2022-05-19 MED ORDER — HYDROCORTISONE ACETATE 25 MG RE SUPP: 25.0000 mg | Freq: Every day | RECTAL | 2 refills | Status: DC

## 2022-05-19 NOTE — Patient Instructions (Addendum)
_______________________________________________________  If your blood pressure at your visit was 140/90 or greater, please contact your primary care physician to follow up on this.  _______________________________________________________  If you are age 73 or older, your body mass index should be between 23-30. Your There is no height or weight on file to calculate BMI. If this is out of the aforementioned range listed, please consider follow up with your Primary Care Provider.  If you are age 22 or younger, your body mass index should be between 19-25. Your There is no height or weight on file to calculate BMI. If this is out of the aformentioned range listed, please consider follow up with your Primary Care Provider.   ________________________________________________________  The [place] [facility] providers would like to encourage you to use [facility] to communicate with providers for non-urgent requests or questions.  Due to long hold times on the telephone, sending your provider a message by [facility] may be a faster and more efficient way to get a response.  Please allow 48 business hours for a response.  Please remember that this is for non-urgent requests.  _______________________________________________________  Please purchase the following medications over the counter and take as directed: Benefiber 1 pack daily in 8oz water Colace 1 tablet daily in [place] water  Repeat colonoscopy for 04-2025. Please call 2 months prior to schedule this. A letter will be sent as it gets closer.     Avoid NSAID's  We have sent the following medications to your pharmacy for you to pick up at your convenience: Anusol Supp daily at bedtime for 2 weeks  Call with any questions or concerns.  Thank you,  Dr. Jackquline Denmark

## 2022-05-19 NOTE — Progress Notes (Signed)
Chief Complaint: FU  Referring Provider:  Bonnita Nasuti, MD      ASSESSMENT AND PLAN;   #1. Rectal bleeding d/t hoids s/p banding x 3. (12/2021, 01/2022, 03/2022).  Nl CBC. Neg colon 04/2020.  #2. IBS-C.   #3. FH colon Ca (sis at age 72).    #4. Abn CT 09/2020 showing liver lesion-likely hemangioma.  Radiology had advised MRI.  Patient wanted to hold off.  Plan:  -Continue Benefiber/Colace with 8 oz water. -Anusol HC suppostories 1 QHS x 2 weeks, 2RF -Avoid NSAIDs. -Rpt colon due Feb 2027. -If still with hemorrhoidal problems, surgical eval for EUA/hoidectomy.  She wants to wait for now.    HPI:    Audrey Pratt is a 72 y.o. female   For FU  Better overall. S/P Hoid banding x 3 At times - "hoid" comes out but goes back. Occ bleeds.  Not as bad as before No constipation as long as she takes Benefiber/Colace. No abdominal or rectal pain.  Rectal exam today revealed a thrombosed internal hemorrhoid.  Heme-negative stools (presence of Brooke)  Neg colonoscopy February 2022 as below.  Denies having any upper GI symptoms.  No nonsteroidals.    Past GI work-up: Colon 04/30/2020 - One 6 mm polyp in the cecum, removed with a cold snare. Resected and retrieved. - Non-bleeding internal hemorrhoids. - Otherwise normal colonoscopy to TI. The colon was highly redundant. - Rpt in 5 yrs d/t FH   CT AP with contrast 09/2020 Large amount of stool seen throughout the colon. No acute abnormality seen in the abdomen or pelvis. Aortic Atherosclerosis (ICD10-I70.0).   CT Abdo/pelvis with contrast 02/13/2020 1. No acute intra-abdominal or pelvic pathology. No bowel obstruction. Normal appendix. 2. A 15 mm left adnexal cyst similar to prior CT. Further evaluation with ultrasound recommended. 3. A 3 cm indeterminate right hepatic mass, likely a hemangioma. MRI may provide better characterization.  She refused MRI. 4. Aortic Atherosclerosis (ICD10-I70.0).   EGD-we do not  have the actual report.  She does bring in pictures 06/25/2015: Small hiatal hernia, erosive esophagitis.  We do have biopsies which were negative for Barrett's.   Colonoscopy 06/25/2015: We do not have the actual report.  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  Also had prominent ileocecal valve with negative biopsies.  Advised to repeat in 5 years.  SH-Melvin is a brother-in-law.  Daughter is a Marine scientist.  Past Medical History:  Diagnosis Date   Allergy    Arthritis    GERD (gastroesophageal reflux disease)     Past Surgical History:  Procedure Laterality Date   BREAST LUMPECTOMY Right    COLONOSCOPY  06/25/2015   every 5 years   HAND TENDON SURGERY Right    PARTIAL HYSTERECTOMY     ROTATOR CUFF REPAIR Right     Family History  Problem Relation Age of Onset   Heart disease Mother    Prostate cancer Father    Heart disease Father    Colon cancer Sister    Stomach cancer Maternal Grandmother    Throat cancer Brother    Prostate cancer Brother    Rectal cancer Neg Hx    Esophageal cancer Neg Hx    Breast cancer Neg Hx     Social History   Tobacco Use   Smoking status: Never   Smokeless tobacco: Never  Vaping Use   Vaping Use: Never used  Substance Use Topics   Alcohol use: Not Currently  Comment: occ wine   Drug use: Never    Current Outpatient Medications  Medication Sig Dispense Refill   amoxicillin (AMOXIL) 500 MG tablet Take 500 mg by mouth every 8 (eight) hours.     aspirin 81 MG chewable tablet 1 tablet     Calcium Carbonate+Vitamin D 600-200 MG-UNIT TABS 1 tablet with a meal     cetirizine (ZYRTEC) 10 MG tablet Zyrtec 10 mg tablet  Take 1 tablet every day by oral route.     Cholecalciferol (VITAMIN D3 PO) Take 1 tablet by mouth daily in the afternoon.     docusate (COLACE) 50 MG/5ML liquid Take by mouth daily.     fluocinonide cream (LIDEX) [id number] % 1 Application as needed.     fluticasone (CUTIVATE) 0.05 % cream Apply topically 2 (two) times  daily. For 10 days and then use as needed 30 g 2   hydrocortisone (ANUSOL-HC) 2.5 % rectal cream Place 1 Application rectally 3 (three) times daily. (Patient taking differently: Place 1 Application rectally as needed.) 30 g 1   linaclotide (LINZESS) 290 MCG CAPS capsule Take 1 capsule by mouth every other day.     meloxicam (MOBIC) 15 MG tablet Take 1 tablet by mouth as needed.     Multiple Vitamins-Minerals (CENTRUM SILVER ULTRA WOMENS) TABS See admin instructions.     Multiple Vitamins-Minerals (ZINC PO) Take 1 tablet by mouth as needed.     omeprazole (PRILOSEC) 20 MG capsule Take 20 mg by mouth daily. Takes 30 minutes before breakfast     PROLIA 60 MG/ML SOSY injection Inject 60 mg into the skin every 6 (six) months.     Wheat Dextrin (BENEFIBER PO) Take 1-2 tablets by mouth as needed.     Zinc 50 MG TABS Take 1 capsule by mouth as needed.     No current facility-administered medications for this visit.    Allergies  Allergen Reactions   Pneumococcal Vac Polyvalent     Swelling and redness at injection site   Latex Rash    Review of Systems:  neg     Physical Exam:    BP 116/68   Pulse 65   SpO2 98%  Wt Readings from Last 3 Encounters:  03/27/22 149 lb (67.6 kg)  03/19/22 150 lb (68 kg)  02/10/22 150 lb 6 oz (68.2 kg)   Constitutional:  Well-developed, in no acute distress. Psychiatric: Normal mood and affect. Behavior is normal. Abdominal: Soft, nondistended. Nontender. Bowel sounds active throughout. There are no masses palpable. No hepatomegaly. Rectal: small thrombosed internal hemorrhoids.  Brown heme-negative stools.  In presence of Brooke Neurological: Alert and oriented to person place and time. Skin: Skin is warm and dry. No rashes noted.      Carmell Austria, MD 05/19/2022, 8:48 AM  Cc: Bonnita Nasuti, MD

## 2022-09-28 ENCOUNTER — Telehealth: Payer: Self-pay | Admitting: Gastroenterology

## 2022-09-28 NOTE — Telephone Encounter (Signed)
Inbound call from patient stating she is having complications with her hemorrhoids. States she is having bleeding and is concerned something else may be going on. Requesting a call back to discuss further. Please advise, thank you.

## 2022-09-28 NOTE — Telephone Encounter (Signed)
Pt stated that she has been having issues with Hemorrhoids off and on for a while now. Pt stated that she seen Dr. Renaldo Fiddler in [facility] when recommended her to have a hemorrhoidectomy and that the hemorrhoid  could not be banded. Pt wanted to  get Dr. Harland German recommendations: Pt was scheduled to see Dr. Chales Abrahams on 10/15/2022 at 8:50 AM. Pt made aware. Pt verbalized understanding with all questions answered.

## 2022-10-05 ENCOUNTER — Other Ambulatory Visit: Payer: Self-pay | Admitting: Internal Medicine

## 2022-10-05 ENCOUNTER — Ambulatory Visit
Admission: RE | Admit: 2022-10-05 | Discharge: 2022-10-05 | Disposition: A | Discharge status: Home / Self Care | Payer: Medicare PPO | Source: Ambulatory Visit | Attending: Internal Medicine | Admitting: Internal Medicine

## 2022-10-05 DIAGNOSIS — R921 Mammographic calcification found on diagnostic imaging of breast: Secondary | ICD-10-CM

## 2022-10-15 ENCOUNTER — Ambulatory Visit: Payer: Medicare PPO | Admitting: Gastroenterology

## 2022-10-15 ENCOUNTER — Encounter: Payer: Self-pay | Admitting: Gastroenterology

## 2022-10-15 VITALS — BP 122/72 | HR 70 | Ht 67.0 in | Wt 149.5 lb

## 2022-10-15 DIAGNOSIS — K581 Irritable bowel syndrome with constipation: Secondary | ICD-10-CM | POA: Diagnosis not present

## 2022-10-15 DIAGNOSIS — K649 Unspecified hemorrhoids: Secondary | ICD-10-CM

## 2022-10-15 DIAGNOSIS — K625 Hemorrhage of anus and rectum: Secondary | ICD-10-CM | POA: Diagnosis not present

## 2022-10-15 DIAGNOSIS — Z800 Family history of malignant neoplasm of digestive organs: Secondary | ICD-10-CM

## 2022-10-15 DIAGNOSIS — R9389 Abnormal findings on diagnostic imaging of other specified body structures: Secondary | ICD-10-CM

## 2022-10-15 NOTE — Patient Instructions (Addendum)
_______________________________________________________  If your blood pressure at your visit was 140/90 or greater, please contact your primary care physician to follow up on this.  _______________________________________________________  If you are age 73 or older, your body mass index should be between 23-30. Your Body mass index is 23.42 kg/m. If this is out of the aforementioned range listed, please consider follow up with your Primary Care Provider.  If you are age 39 or younger, your body mass index should be between 19-25. Your Body mass index is 23.42 kg/m. If this is out of the aformentioned range listed, please consider follow up with your Primary Care Provider.   ________________________________________________________  The [facility] providers would like to encourage you to use [facility] to communicate with providers for non-urgent requests or questions.  Due to long hold times on the telephone, sending your provider a message by [facility] may be a faster and more efficient way to get a response.  Please allow 48 business hours for a response.  Please remember that this is for non-urgent requests.  _______________________________________________________  Continue Colace with 8 oz water. Continue Linzess daily  Please purchase the following medications over the counter and take as directed: Prep H suppostories 1 daily at bedtime x 2 weeks OTC  Avoid NSAIDs.  Two days before your procedure: Mix 3 packs (or capfuls) of Miralax in 48 ounces of clear liquid and drink at 6pm.  You have been scheduled for a colonoscopy. Please follow written instructions given to you at your visit today.   Please pick up your prep supplies at the pharmacy within the next 1-3 days.  If you use inhalers (even only as needed), please bring them with you on the day of your procedure.  DO NOT TAKE 7 DAYS PRIOR TO TEST- Trulicity (dulaglutide) Ozempic, Wegovy (semaglutide) Mounjaro  (tirzepatide) Bydureon Bcise (exanatide extended release)  DO NOT TAKE 1 DAY PRIOR TO YOUR TEST Rybelsus (semaglutide) Adlyxin (lixisenatide) Victoza (liraglutide) Byetta (exanatide) ___________________________________________________________________________

## 2022-10-15 NOTE — Progress Notes (Signed)
Chief Complaint: FU  Referring Provider:  Galvin Proffer, MD      ASSESSMENT AND PLAN;   #1. Rectal bleeding d/t hoids s/p banding x 3. (12/2021, 01/2022, 03/2022).  Nl CBC.   #2. IBS-C.   #3. FH colon Ca (sis at age 72).    #4. Abn CT 09/2020 showing liver lesion-likely hemangioma.  Radiology had advised MRI.  Patient wanted to hold off.  Plan:  -Continue Colace with 8 oz water. -Continue Linzess po QD -prep H suppostories 1 QHS x 2 weeks OTC -Avoid NSAIDs. -Rpt colon with 2 day prep. -If still with hemorrhoidal problems, surgical eval for EUA/hoidectomy.  She wants to wait for now.    HPI:    Audrey Pratt is a 72 y.o. female   For FU  Better overall.  Still having occasional rectal bleeding.  Very much concerned about colon cancers S/P Hoid banding x 3 At times - "hoid" comes out but goes back. No constipation as long as she takes Colace and Linzess, eating better No abdominal or rectal pain.   Denies having any upper GI symptoms.  No nonsteroidals.  Wt Readings from Last 3 Encounters:  10/15/22 149 lb 8 oz (67.8 kg)  05/19/22 149 lb 6 oz (67.8 kg)  03/27/22 149 lb (67.6 kg)     Past GI work-up: Colon 04/30/2020 - One 6 mm polyp in the cecum, removed with a cold snare. Resected and retrieved. - Non-bleeding internal hemorrhoids. - Otherwise normal colonoscopy to TI. The colon was highly redundant. - Rpt in 5 yrs d/t FH   CT AP with contrast 09/2020 Large amount of stool seen throughout the colon. No acute abnormality seen in the abdomen or pelvis. Aortic Atherosclerosis (ICD10-I70.0).   CT Abdo/pelvis with contrast 02/13/2020 1. No acute intra-abdominal or pelvic pathology. No bowel obstruction. Normal appendix. 2. A 15 mm left adnexal cyst similar to prior CT. Further evaluation with ultrasound recommended. 3. A 3 cm indeterminate right hepatic mass, likely a hemangioma. MRI may provide better characterization.  She refused MRI. 4.  Aortic Atherosclerosis (ICD10-I70.0).   EGD-we do not have the actual report.  She does bring in pictures 06/25/2015: Small hiatal hernia, erosive esophagitis.  We do have biopsies which were negative for Barrett's.   Colonoscopy 06/25/2015: We do not have the actual report.  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  Also had prominent ileocecal valve with negative biopsies.  Advised to repeat in 5 years.  SH-Melvin is a brother-in-law.  Daughter is a Engineer, civil (consulting).  Past Medical History:  Diagnosis Date   Allergy    Arthritis    GERD (gastroesophageal reflux disease)     Past Surgical History:  Procedure Laterality Date   BREAST LUMPECTOMY Right    COLONOSCOPY  06/25/2015   every 5 years   HAND TENDON SURGERY Right    PARTIAL HYSTERECTOMY     ROTATOR CUFF REPAIR Right     Family History  Problem Relation Age of Onset   Heart disease Mother    Prostate cancer Father    Heart disease Father    Colon cancer Sister    Stomach cancer Maternal Grandmother    Throat cancer Brother    Prostate cancer Brother    Rectal cancer Neg Hx    Esophageal cancer Neg Hx    Breast cancer Neg Hx     Social History   Tobacco Use   Smoking status: Never   Smokeless tobacco: Never  Vaping Use   Vaping status: Never Used  Substance Use Topics   Alcohol use: Not Currently    Comment: occ wine   Drug use: Never    Current Outpatient Medications  Medication Sig Dispense Refill   aspirin 81 MG chewable tablet 1 tablet     Calcium Carbonate+Vitamin D 600-200 MG-UNIT TABS 1 tablet with a meal     cetirizine (ZYRTEC) 10 MG tablet Zyrtec 10 mg tablet  Take 1 tablet every day by oral route.     Cholecalciferol (VITAMIN D3 PO) Take 1 tablet by mouth daily in the afternoon.     docusate (COLACE) 50 MG/5ML liquid Take by mouth daily.     fluocinonide cream (LIDEX) 0.05 % 1 Application as needed.     fluticasone (CUTIVATE) 0.05 % cream Apply topically 2 (two) times daily. For 10 days and  then use as needed 30 g 2   linaclotide (LINZESS) 290 MCG CAPS capsule Take 1 capsule by mouth every other day.     meloxicam (MOBIC) 15 MG tablet Take 1 tablet by mouth as needed.     Multiple Vitamins-Minerals (CENTRUM SILVER ULTRA WOMENS) TABS See admin instructions.     omeprazole (PRILOSEC) 20 MG capsule Take 20 mg by mouth daily. Takes 30 minutes before breakfast     PROLIA 60 MG/ML SOSY injection Inject 60 mg into the skin every 6 (six) months.     Wheat Dextrin (BENEFIBER PO) Take 1-2 tablets by mouth as needed.     hydrocortisone (ANUSOL-HC) 2.5 % rectal cream Place 1 Application rectally 3 (three) times daily. (Patient not taking: Reported on 10/15/2022) 30 g 1   No current facility-administered medications for this visit.    Allergies  Allergen Reactions   Pneumococcal Vac Polyvalent     Swelling and redness at injection site   Latex Rash    Review of Systems:  neg     Physical Exam:    BP 122/72   Pulse 70   Ht 5\' 7"  (1.702 m)   Wt 149 lb 8 oz (67.8 kg)   SpO2 98%   BMI 23.42 kg/m  Wt Readings from Last 3 Encounters:  10/15/22 149 lb 8 oz (67.8 kg)  05/19/22 149 lb 6 oz (67.8 kg)  03/27/22 149 lb (67.6 kg)   Constitutional:  Well-developed, in no acute distress. Psychiatric: Normal mood and affect. Behavior is normal. Abdominal: Soft, nondistended. Nontender. Bowel sounds active throughout. There are no masses palpable. No hepatomegaly. Rectal: to be performed the time of colonoscopy Neurological: Alert and oriented to person place and time. Skin: Skin is warm and dry. No rashes noted.      Edman Circle, MD 10/15/2022, 9:30 AM  Cc: Galvin Proffer, MD

## 2022-10-20 ENCOUNTER — Telehealth: Payer: Self-pay | Admitting: Gastroenterology

## 2022-10-20 NOTE — Telephone Encounter (Signed)
Inbound call from patient stating that over the weekend she had blood in stool. Patient stated that she is currently scheduled to have a colon on 9/11 and wanted to be scheduled sooner. I advise patient that was the soonest that we had and that she could call periodically to see if there had been any cancellations. Patient is requesting a call to discuss. Please advise.

## 2022-10-21 NOTE — Telephone Encounter (Signed)
Pt stated that she noticed some blood in the toilet on Saturday and Sunday, and none since then. Pt chart was reviewed. Pt had recent office visit on 10/15/2022 for rectal bleeding. Pt scheduled for Colonoscopy on 11/25/2022 at time of office visit. Pt was encouraged to use sitz bath, recticare, wet wipes. Pt notified to call our office back if bleeding increases.  Pt verbalized understanding with all questions answered.

## 2022-11-04 ENCOUNTER — Other Ambulatory Visit: Payer: Self-pay | Admitting: Internal Medicine

## 2022-11-04 ENCOUNTER — Encounter: Payer: Self-pay | Admitting: Internal Medicine

## 2022-11-04 DIAGNOSIS — M810 Age-related osteoporosis without current pathological fracture: Secondary | ICD-10-CM

## 2022-11-25 ENCOUNTER — Ambulatory Visit (AMBULATORY_SURGERY_CENTER): Payer: Medicare PPO | Admitting: Gastroenterology

## 2022-11-25 ENCOUNTER — Encounter: Payer: Self-pay | Admitting: Gastroenterology

## 2022-11-25 ENCOUNTER — Telehealth: Payer: Self-pay | Admitting: Gastroenterology

## 2022-11-25 VITALS — BP 110/60 | HR 55 | Temp 97.2°F | Resp 15 | Ht 67.0 in | Wt 149.0 lb

## 2022-11-25 DIAGNOSIS — K625 Hemorrhage of anus and rectum: Secondary | ICD-10-CM | POA: Diagnosis not present

## 2022-11-25 MED ORDER — HYDROCORTISONE ACETATE 25 MG RE SUPP: 25.0000 mg | Freq: Two times a day (BID) | RECTAL | 4 refills | Status: DC

## 2022-11-25 MED ORDER — SODIUM CHLORIDE 0.9 % IV SOLN: 500.0000 mL | INTRAVENOUS | Status: DC

## 2022-11-25 NOTE — Op Note (Addendum)
[facility] Patient Name: Audrey Pratt Procedure Date: 11/25/2022 8:05 AM MRN: [id number] Endoscopist: Lynann Bologna , MD, [id number] Age: 72 Referring MD:  Date of Birth: [birth year] Gender: Female Account #: [id number] Procedure:                Colonoscopy Indications:              #1. Rectal bleeding d/t hoids s/p banding x 3.                            (12/2021, 01/2022, 03/2022). Nl CBC.                           #2. IBS-C.                           #3. FH colon Ca (sis at age 23). Medicines:                Monitored Anesthesia Care Procedure:                Pre-Anesthesia Assessment:                           - Prior to the procedure, a History and Physical                            was performed, and patient medications and                            allergies were reviewed. The patient's tolerance of                            previous anesthesia was also reviewed. The risks                            and benefits of the procedure and the sedation                            options and risks were discussed with the patient.                            All questions were answered, and informed consent                            was obtained. Prior Anticoagulants: The patient has                            taken no anticoagulant or antiplatelet agents. ASA                            Grade Assessment: II - A patient with mild systemic                            disease. After reviewing the risks and benefits,  the patient was deemed in satisfactory condition to                            undergo the procedure.                           After obtaining informed consent, the colonoscope                            was passed under direct vision. Throughout the                            procedure, the patient's blood pressure, pulse, and                            oxygen saturations were monitored continuously. The                             [id number]-[id number] Colonoscope [id number] was introduced                            through the anus and advanced to the 2 cm into the                            ileum. The colonoscopy was performed without                            difficulty. The patient tolerated the procedure                            well. The quality of the bowel preparation was                            good. The terminal ileum, ileocecal valve,                            appendiceal orifice, and rectum were photographed. Scope In: 8:10:44 AM Scope Out: 8:23:09 AM Scope Withdrawal Time: 0 hours 7 minutes 38 seconds  Total Procedure Duration: 0 hours 12 minutes 25 seconds  Findings:                 A few medium-mouthed diverticula were found in the                            sigmoid colon.                           Non-bleeding internal hemorrhoids were found during                            retroflexion. The hemorrhoids were moderate and                            Grade I (internal hemorrhoids that do not  prolapse). Did have some red wale markings                            suggestive of recent bleeding.                           The terminal ileum appeared normal.                           The exam was otherwise without abnormality on                            direct and retroflexion views. Complications:            No immediate complications. Estimated Blood Loss:     Estimated blood loss: none. Impression:               - Mild sigmoid diverticulosis.                           - Non-bleeding internal hemorrhoids. Likely                            etiology of rectal bleeding. No bleeding currently.                           - The examined portion of the ileum was normal.                           - The examination was otherwise normal on direct                            and retroflexion views.                           - No specimens collected. Recommendation:           - Patient has a  contact number available for                            emergencies. The signs and symptoms of potential                            delayed complications were discussed with the                            patient. Return to normal activities tomorrow.                            Written discharge instructions were provided to the                            patient.                           - Resume previous diet.                           -  Continue present medications.                           - Use Benefiber one teaspoon PO daily with 8 ounces                            of water.                           - Use hydrocortisone suppository 25 mg BID per                            rectum once a day for 10 days. 4RF                           - No need to repeat screening colonoscopy unless                            any new problems.                           - The findings and recommendations were discussed                            with the patient's family. If still with problems,                            surgical referral for possible EUA/hemorrhoidectomy. Lynann Bologna, MD 11/25/2022 8:31:01 AM This report has been signed electronically.

## 2022-11-25 NOTE — Progress Notes (Signed)
Chief Complaint: FU  Referring Provider:  Galvin Proffer, MD      ASSESSMENT AND PLAN;   #1. Rectal bleeding d/t hoids s/p banding x 3. (12/2021, 01/2022, 03/2022).  Nl CBC.   #2. IBS-C.   #3. FH colon Ca (sis at age 72).    #4. Abn CT 09/2020 showing liver lesion-likely hemangioma.  Radiology had advised MRI.  Patient wanted to hold off.  Plan:  -Continue Colace with 8 oz water. -Continue Linzess po QD -prep H suppostories 1 QHS x 2 weeks OTC -Avoid NSAIDs. -Rpt colon with 2 day prep. -If still with hemorrhoidal problems, surgical eval for EUA/hoidectomy.  She wants to wait for now.    HPI:    Audrey Pratt is a 72 y.o. female   For FU  Better overall.  Still having occasional rectal bleeding.  Very much concerned about colon cancers S/P Hoid banding x 3 At times - "hoid" comes out but goes back. No constipation as long as she takes Colace and Linzess, eating better No abdominal or rectal pain.   Denies having any upper GI symptoms.  No nonsteroidals.  Wt Readings from Last 3 Encounters:  11/25/22 149 lb (67.6 kg)  10/15/22 149 lb 8 oz (67.8 kg)  05/19/22 149 lb 6 oz (67.8 kg)     Past GI work-up: Colon 04/30/2020 - One 6 mm polyp in the cecum, removed with a cold snare. Resected and retrieved. - Non-bleeding internal hemorrhoids. - Otherwise normal colonoscopy to TI. The colon was highly redundant. - Rpt in 5 yrs d/t FH   CT AP with contrast 09/2020 Large amount of stool seen throughout the colon. No acute abnormality seen in the abdomen or pelvis. Aortic Atherosclerosis (ICD10-I70.0).   CT Abdo/pelvis with contrast 02/13/2020 1. No acute intra-abdominal or pelvic pathology. No bowel obstruction. Normal appendix. 2. A 15 mm left adnexal cyst similar to prior CT. Further evaluation with ultrasound recommended. 3. A 3 cm indeterminate right hepatic mass, likely a hemangioma. MRI may provide better characterization.  She refused MRI. 4.  Aortic Atherosclerosis (ICD10-I70.0).   EGD-we do not have the actual report.  She does bring in pictures 06/25/2015: Small hiatal hernia, erosive esophagitis.  We do have biopsies which were negative for Barrett's.   Colonoscopy 06/25/2015: We do not have the actual report.  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  Also had prominent ileocecal valve with negative biopsies.  Advised to repeat in 5 years.  SH-Melvin is a brother-in-law.  Daughter is a Engineer, civil (consulting).  Past Medical History:  Diagnosis Date   Allergy    Arthritis    GERD (gastroesophageal reflux disease)     Past Surgical History:  Procedure Laterality Date   BREAST LUMPECTOMY Right    COLONOSCOPY  06/25/2015   every 5 years   HAND TENDON SURGERY Right    PARTIAL HYSTERECTOMY     ROTATOR CUFF REPAIR Right     Family History  Problem Relation Age of Onset   Heart disease Mother    Prostate cancer Father    Heart disease Father    Colon cancer Sister    Stomach cancer Maternal Grandmother    Throat cancer Brother    Prostate cancer Brother    Rectal cancer Neg Hx    Esophageal cancer Neg Hx    Breast cancer Neg Hx     Social History   Tobacco Use   Smoking status: Never   Smokeless tobacco: Never  Vaping Use   Vaping status: Never Used  Substance Use Topics   Alcohol use: Not Currently    Comment: occ wine   Drug use: Never    Current Outpatient Medications  Medication Sig Dispense Refill   Calcium Carbonate+Vitamin D 600-200 MG-UNIT TABS 1 tablet with a meal     cetirizine (ZYRTEC) 10 MG tablet Zyrtec 10 mg tablet  Take 1 tablet every day by oral route.     Cholecalciferol (VITAMIN D3 PO) Take 1 tablet by mouth daily in the afternoon.     docusate (COLACE) 50 MG/5ML liquid Take by mouth daily.     fluticasone (FLONASE) 50 MCG/ACT nasal spray Place 2 sprays into both nostrils daily.     linaclotide (LINZESS) 290 MCG CAPS capsule Take 1 capsule by mouth every other day.     Multiple  Vitamins-Minerals (CENTRUM SILVER ULTRA WOMENS) TABS See admin instructions.     omeprazole (PRILOSEC) 20 MG capsule Take 20 mg by mouth daily. Takes 30 minutes before breakfast     triamcinolone ointment (KENALOG) 0.1 % Apply 1 Application topically daily.     fluocinonide cream (LIDEX) 0.05 % 1 Application as needed.     fluticasone (CUTIVATE) 0.05 % cream Apply topically 2 (two) times daily. For 10 days and then use as needed 30 g 2   hydrocortisone (ANUSOL-HC) 2.5 % rectal cream Place 1 Application rectally 3 (three) times daily. (Patient not taking: Reported on 10/15/2022) 30 g 1   lidocaine (LIDODERM) 5 % Place 1 patch onto the skin daily.     meloxicam (MOBIC) 15 MG tablet Take 1 tablet by mouth as needed. (Patient not taking: Reported on 10/15/2022)     PROLIA 60 MG/ML SOSY injection Inject 60 mg into the skin every 6 (six) months.     Wheat Dextrin (BENEFIBER PO) Take 1-2 tablets by mouth as needed. (Patient not taking: Reported on 11/25/2022)     Current Facility-Administered Medications  Medication Dose Route Frequency Provider Last Rate Last Admin   0.9 %  sodium chloride infusion  500 mL Intravenous Continuous Lynann Bologna, MD        Allergies  Allergen Reactions   Pneumococcal Vac Polyvalent     Swelling and redness at injection site   Latex Rash    Review of Systems:  neg     Physical Exam:    BP 124/74   Pulse 62   Temp (!) 97.2 F (36.2 C)   Ht 5\' 7"  (1.702 m)   Wt 149 lb (67.6 kg)   SpO2 99%   BMI 23.34 kg/m  Wt Readings from Last 3 Encounters:  11/25/22 149 lb (67.6 kg)  10/15/22 149 lb 8 oz (67.8 kg)  05/19/22 149 lb 6 oz (67.8 kg)   Constitutional:  Well-developed, in no acute distress. Psychiatric: Normal mood and affect. Behavior is normal. Abdominal: Soft, nondistended. Nontender. Bowel sounds active throughout. There are no masses palpable. No hepatomegaly. Rectal: to be performed the time of colonoscopy Neurological: Alert and oriented to person  place and time. Skin: Skin is warm and dry. No rashes noted.      Edman Circle, MD 11/25/2022, 8:04 AM  Cc: Galvin Proffer, MD

## 2022-11-25 NOTE — Telephone Encounter (Signed)
Patient called states she is currently at the pharmacy trying to pick up Hydrocortisone and her insurance does not cover it seeking an alternative.

## 2022-11-25 NOTE — Patient Instructions (Addendum)
Please read handouts provided. Continue present medications. Hydrocortisone suppository 25 mg per rectum twice daily for 10 days. Use Benefiber one teaspoon daily with 8 ounces of water.  YOU HAD AN ENDOSCOPIC PROCEDURE TODAY AT THE [facility]:   Refer to the procedure report that was given to you for any specific questions about what was found during the examination.  If the procedure report does not answer your questions, please call your gastroenterologist to clarify.  If you requested that your care partner not be given the details of your procedure findings, then the procedure report has been included in a sealed envelope for you to review at your convenience later.  YOU SHOULD EXPECT: Some feelings of bloating in the abdomen. Passage of more gas than usual.  Walking can help get rid of the air that was put into your GI tract during the procedure and reduce the bloating. If you had a lower endoscopy (such as a colonoscopy or flexible sigmoidoscopy) you may notice spotting of blood in your stool or on the toilet paper. If you underwent a bowel prep for your procedure, you may not have a normal bowel movement for a few days.  Please Note:  You might notice some irritation and congestion in your nose or some drainage.  This is from the oxygen used during your procedure.  There is no need for concern and it should clear up in a day or so.  SYMPTOMS TO REPORT IMMEDIATELY:  Following lower endoscopy (colonoscopy or flexible sigmoidoscopy):  Excessive amounts of blood in the stool  Significant tenderness or worsening of abdominal pains  Swelling of the abdomen that is new, acute  Fever of 100F or higher   For urgent or emergent issues, a gastroenterologist can be reached at any hour by calling [phone]. Do not use MyChart messaging for urgent concerns.    DIET:  We do recommend a small meal at first, but then you may proceed to your regular diet.  Drink plenty of fluids but  you should avoid alcoholic beverages for 24 hours.  ACTIVITY:  You should plan to take it easy for the rest of today and you should NOT DRIVE or use heavy machinery until tomorrow (because of the sedation medicines used during the test).    FOLLOW UP: Our staff will call the number listed on your records the next business day following your procedure.  We will call around 7:15- 8:00 am to check on you and address any questions or concerns that you may have regarding the information given to you following your procedure. If we do not reach you, we will leave a message.     If any biopsies were taken you will be contacted by phone or by letter within the next 1-3 weeks.  Please call us at [phone] if you have not heard about the biopsies in 3 weeks.    SIGNATURES/CONFIDENTIALITY: You and/or your care partner have signed paperwork which will be entered into your electronic medical record.  These signatures attest to the fact that that the information above on your After Visit Summary has been reviewed and is understood.  Full responsibility of the confidentiality of this discharge information lies with you and/or your care-partner.

## 2022-11-25 NOTE — Progress Notes (Signed)
Report to PACU, RN, vss, BBS= Clear.  

## 2022-11-25 NOTE — Telephone Encounter (Signed)
Called patient back and let her know that per Dr. Chales Abrahams she could use preparation H OTC, instead. He said it was not as strong but would be cheaper. Patient agreed and will do that, no further questions but will call back if needed.

## 2022-11-26 ENCOUNTER — Telehealth: Payer: Self-pay

## 2022-11-26 NOTE — Telephone Encounter (Signed)
Left message on follow up call. 

## 2023-01-04 ENCOUNTER — Telehealth: Payer: Self-pay | Admitting: Gastroenterology

## 2023-01-04 NOTE — Telephone Encounter (Signed)
Inbound call from patient, states she believes a hemorrhoids burst and she is now bleeding rectally and has rectal pain. Would like to discuss hemorrhoid removal.

## 2023-01-04 NOTE — Telephone Encounter (Signed)
Pt stated that she had a hemorrhoid to burst in the middle of the night and was bleeding. Pt stated that the bleeding has stopped although she is still having rectal pain. Pt stated that she has had a history of hemorrhoids internal and external. Pt question if she needs a banding or possible surgery for the internal.  Pt was scheduled to see Dr. Chales Abrahams on Oct 28 at 1:30 PM. Pt made aware. Pt verbalized understanding with all questions answered.

## 2023-01-11 ENCOUNTER — Ambulatory Visit: Payer: Medicare PPO | Admitting: Gastroenterology

## 2023-01-11 ENCOUNTER — Encounter: Payer: Self-pay | Admitting: Gastroenterology

## 2023-01-11 VITALS — BP 110/70 | HR 69 | Ht 67.0 in | Wt 148.0 lb

## 2023-01-11 DIAGNOSIS — K625 Hemorrhage of anus and rectum: Secondary | ICD-10-CM | POA: Diagnosis not present

## 2023-01-11 NOTE — Patient Instructions (Addendum)
Continue Colace.   Continue Linzess 290 mcg daily before breakfast.   Use Calmol 4 twice daily for 4 days.   May use Prep H suppositories twice daily if Calmol 4 not helpful.   Referral placed to [facility].   _______________________________________________________  If your blood pressure at your visit was 140/90 or greater, please contact your primary care physician to follow up on this.  _______________________________________________________  If you are age 47 or older, your body mass index should be between 23-30. Your Body mass index is 23.18 kg/m. If this is out of the aforementioned range listed, please consider follow up with your Primary Care Provider.  If you are age 5 or younger, your body mass index should be between 19-25. Your Body mass index is 23.18 kg/m. If this is out of the aformentioned range listed, please consider follow up with your Primary Care Provider.   ________________________________________________________  The [place] providers would like to encourage you to use [facility] to communicate with providers for non-urgent requests or questions.  Due to long hold times on the telephone, sending your provider a message by [facility] may be a faster and more efficient way to get a response.  Please allow 48 business hours for a response.  Please remember that this is for non-urgent requests.  _______________________________________________________

## 2023-01-11 NOTE — Progress Notes (Signed)
Chief Complaint: FU  Referring Provider:  Galvin Proffer, MD      ASSESSMENT AND PLAN;   #1. Rectal bleeding d/t hoids.  Will do medical treatment and banding x 3. (12/2021, 01/2022, 03/2022).  Nl CBC. Neg colon 11/2022 other than hemorrhoids  #2. IBS-C.   #3. FH colon Ca (sis at age 72).  Neg colon 11/2022  Plan:  -Continue Colace with 8 oz water. -Continue Linzess po QD -prep H suppostories BID x 2 weeks OTC. We had samples for Calmol4 supp BID x 4 days -Avoid NSAIDs. -Appt with [facility]x for EUA/hoidectomy.    HPI:    Audrey Pratt is a 72 y.o. female   For FU Continued but occasional rectal bleeding Neg recent colon 11/2022- neg except for Hoids S/P failed Hoid banding x 3 At times - "hoid" comes out but goes back. No constipation as long as she takes Colace and Linzess, eating better No abdominal pain but occ rectal pain.   Denies having any upper GI symptoms.  No nonsteroidals.  Wt Readings from Last 3 Encounters:  01/11/23 148 lb (67.1 kg)  11/25/22 149 lb (67.6 kg)  10/15/22 149 lb 8 oz (67.8 kg)     Past GI work-up: Colon 11/25/2022 - Mild sigmoid diverticulosis. - Non-bleeding internal hemorrhoids. Likely etiology of rectal bleeding. No bleeding currently. - The examined portion of the ileum was normal. - The examination was otherwise normal on direct and retroflexion views. - No specimens collected. -No need to rpt.   Colon 04/30/2020 - One 6 mm polyp in the cecum, removed with a cold snare. Resected and retrieved. - Non-bleeding internal hemorrhoids. - Otherwise normal colonoscopy to TI. The colon was highly redundant. - Rpt in 5 yrs d/t FH   CT AP with contrast 09/2020 Large amount of stool seen throughout the colon. No acute abnormality seen in the abdomen or pelvis. Aortic Atherosclerosis (ICD10-I70.0).   CT Abdo/pelvis with contrast 02/13/2020 1. No acute intra-abdominal or pelvic pathology. No  bowel obstruction. Normal appendix. 2. A 15 mm left adnexal cyst similar to prior CT. Further evaluation with ultrasound recommended. 3. A 3 cm indeterminate right hepatic mass, likely a hemangioma. MRI may provide better characterization.  She refused MRI. 4. Aortic Atherosclerosis (ICD10-I70.0).   EGD-we do not have the actual report.  She does bring in pictures 06/25/2015: Small hiatal hernia, erosive esophagitis.  We do have biopsies which were negative for Barrett's.   Colonoscopy 06/25/2015: We do not have the actual report.  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  Also had prominent ileocecal valve with negative biopsies.  Advised to repeat in 5 years.  SH-Melvin is a brother-in-law.  Daughter is a Engineer, civil (consulting).  Past Medical History:  Diagnosis Date   Allergy    Arthritis    GERD (gastroesophageal reflux disease)     Past Surgical History:  Procedure Laterality Date   BREAST LUMPECTOMY Right    COLONOSCOPY  06/25/2015   every 5 years   HAND TENDON SURGERY Right    PARTIAL HYSTERECTOMY     ROTATOR CUFF REPAIR Right     Family History  Problem Relation Age of Onset   Heart disease Mother    Prostate cancer Father    Heart disease Father    Colon cancer Sister    Stomach cancer Maternal Grandmother    Throat cancer Brother    Prostate cancer Brother    Rectal cancer Neg  Hx    Esophageal cancer Neg Hx    Breast cancer Neg Hx     Social History   Tobacco Use   Smoking status: Never   Smokeless tobacco: Never  Vaping Use   Vaping status: Never Used  Substance Use Topics   Alcohol use: Not Currently    Comment: occ wine   Drug use: Never    Current Outpatient Medications  Medication Sig Dispense Refill   Calcium Carbonate+Vitamin D 600-200 MG-UNIT TABS 1 tablet with a meal     cetirizine (ZYRTEC) 10 MG tablet Zyrtec 10 mg tablet  Take 1 tablet every day by oral route.     Cholecalciferol (VITAMIN D3 PO) Take 1 tablet by mouth daily in the afternoon.      docusate (COLACE) 50 MG/5ML liquid Take by mouth daily.     fluocinonide cream (LIDEX) 0.05 % 1 Application as needed.     fluticasone (CUTIVATE) 0.05 % cream Apply topically 2 (two) times daily. For 10 days and then use as needed 30 g 2   fluticasone (FLONASE) 50 MCG/ACT nasal spray Place 2 sprays into both nostrils daily.     hydrocortisone (ANUSOL-HC) 25 MG suppository Place 1 suppository (25 mg total) rectally 2 (two) times daily. Hydrocortisone suppository 25 mg twice daily per rectum for 10 days. 12 suppository 4   lidocaine (LIDODERM) 5 % Place 1 patch onto the skin daily.     linaclotide (LINZESS) 290 MCG CAPS capsule Take 1 capsule by mouth every other day.     Multiple Vitamins-Minerals (CENTRUM SILVER ULTRA WOMENS) TABS See admin instructions.     omeprazole (PRILOSEC) 20 MG capsule Take 20 mg by mouth daily. Takes 30 minutes before breakfast     PROLIA 60 MG/ML SOSY injection Inject 60 mg into the skin every 6 (six) months.     triamcinolone ointment (KENALOG) 0.1 % Apply 1 Application topically daily.     Wheat Dextrin (BENEFIBER PO) Take 1-2 tablets by mouth as needed.     hydrocortisone (ANUSOL-HC) 2.5 % rectal cream Place 1 Application rectally 3 (three) times daily. (Patient not taking: Reported on 10/15/2022) 30 g 1   meloxicam (MOBIC) 15 MG tablet Take 1 tablet by mouth as needed. (Patient not taking: Reported on 10/15/2022)     No current facility-administered medications for this visit.    Allergies  Allergen Reactions   Pneumococcal Vac Polyvalent     Swelling and redness at injection site   Latex Rash    Review of Systems:  neg     Physical Exam:    BP 110/70   Pulse 69   Ht 5\' 7"  (1.702 m)   Wt 148 lb (67.1 kg)   BMI 23.18 kg/m  Wt Readings from Last 3 Encounters:  01/11/23 148 lb (67.1 kg)  11/25/22 149 lb (67.6 kg)  10/15/22 149 lb 8 oz (67.8 kg)   Constitutional:  Well-developed, in no acute distress. Psychiatric: Normal mood and affect.  Behavior is normal. Abdominal: Soft, nondistended. Nontender. Bowel sounds active throughout. There are no masses palpable. No hepatomegaly.  Rectal examination was performed at the time of colonoscopy.  She wants to hold off on rectal exam today. Neurological: Alert and oriented to person place and time. Skin: Skin is warm and dry. No rashes noted.      Edman Circle, MD 01/11/2023, 1:39 PM  Cc: Galvin Proffer, MD

## 2023-01-24 ENCOUNTER — Emergency Department (HOSPITAL_COMMUNITY): Payer: Medicare PPO

## 2023-01-24 ENCOUNTER — Emergency Department (HOSPITAL_COMMUNITY)
Admission: EM | Admit: 2023-01-24 | Discharge: 2023-01-24 | Disposition: A | Discharge status: Home / Self Care | Payer: Medicare PPO | Attending: Emergency Medicine | Admitting: Emergency Medicine

## 2023-01-24 ENCOUNTER — Encounter (HOSPITAL_COMMUNITY): Payer: Self-pay

## 2023-01-24 ENCOUNTER — Other Ambulatory Visit: Payer: Self-pay

## 2023-01-24 DIAGNOSIS — Z91040 Latex allergy status: Secondary | ICD-10-CM | POA: Insufficient documentation

## 2023-01-24 DIAGNOSIS — K625 Hemorrhage of anus and rectum: Secondary | ICD-10-CM

## 2023-01-24 DIAGNOSIS — R1032 Left lower quadrant pain: Secondary | ICD-10-CM | POA: Insufficient documentation

## 2023-01-24 DIAGNOSIS — K644 Residual hemorrhoidal skin tags: Secondary | ICD-10-CM | POA: Diagnosis not present

## 2023-01-24 LAB — URINALYSIS, ROUTINE W REFLEX MICROSCOPIC
Bacteria, UA: NONE SEEN
Bilirubin Urine: NEGATIVE
Glucose, UA: NEGATIVE mg/dL
Ketones, ur: NEGATIVE mg/dL
Leukocytes,Ua: NEGATIVE
Nitrite: NEGATIVE
Protein, ur: NEGATIVE mg/dL
Specific Gravity, Urine: 1.009 (ref 1.005–1.030)
pH: 5 (ref 5.0–8.0)

## 2023-01-24 LAB — POC OCCULT BLOOD, ED: Fecal Occult Bld: POSITIVE — AB

## 2023-01-24 LAB — CBC
HCT: 39.6 % (ref 36.0–46.0)
Hemoglobin: 13.5 g/dL (ref 12.0–15.0)
MCH: 29.3 pg (ref 26.0–34.0)
MCHC: 34.1 g/dL (ref 30.0–36.0)
MCV: 86.1 fL (ref 80.0–100.0)
Platelets: 147 10*3/uL — ABNORMAL LOW (ref 150–400)
RBC: 4.6 MIL/uL (ref 3.87–5.11)
RDW: 13.2 % (ref 11.5–15.5)
WBC: 4 10*3/uL (ref 4.0–10.5)
nRBC: 0 % (ref 0.0–0.2)

## 2023-01-24 LAB — COMPREHENSIVE METABOLIC PANEL
ALT: 14 U/L (ref 0–44)
AST: 18 U/L (ref 15–41)
Albumin: 3.9 g/dL (ref 3.5–5.0)
Alkaline Phosphatase: 35 U/L — ABNORMAL LOW (ref 38–126)
Anion gap: 7 (ref 5–15)
BUN: 14 mg/dL (ref 8–23)
CO2: 23 mmol/L (ref 22–32)
Calcium: 9.1 mg/dL (ref 8.9–10.3)
Chloride: 111 mmol/L (ref 98–111)
Creatinine, Ser: 0.76 mg/dL (ref 0.44–1.00)
GFR, Estimated: >60 mL/min (ref >60)
Glucose, Bld: 109 mg/dL — ABNORMAL HIGH (ref 70–99)
Potassium: 3.5 mmol/L (ref 3.5–5.1)
Sodium: 141 mmol/L (ref 135–145)
Total Bilirubin: 0.7 mg/dL (ref <1.2)
Total Protein: 6 g/dL — ABNORMAL LOW (ref 6.5–8.1)

## 2023-01-24 LAB — TYPE AND SCREEN
ABO/RH(D): AB POS
Antibody Screen: NEGATIVE

## 2023-01-24 MED ORDER — IOHEXOL 350 MG/ML SOLN
75.0000 mL | Freq: Once | INTRAVENOUS | Status: AC | PRN
  Administered 2023-01-24: 75 mL via INTRAVENOUS

## 2023-01-24 MED ORDER — LIDOCAINE 5 % EX OINT: 1.0000 | TOPICAL_OINTMENT | CUTANEOUS | 0 refills | Status: DC | PRN

## 2023-01-24 NOTE — ED Triage Notes (Signed)
Reports left lower quad pain for a couple of days and then this morning had bright red blood with some clots.  Reports had colonscopy last April and was told had diverticulosis.

## 2023-01-24 NOTE — Discharge Instructions (Addendum)
Your blood count looked reassuring today.  Your hemoglobin is normal, this would be low if you have lost too much blood.  Your CT scan did not show any concerning findings today.  Per Dr. Urban Gibson prior recommendation, continue on daily Colace and 8 ounces of water and daily Linzess.  Refrain from straining when using the restroom.  You have been prescribed a lidocaine cream (numbing cream) to apply to your rectum as needed for hemorrhoid pain.  Please follow-up with your gastroenterologist Dr. Chales Abrahams if you continue to experience rectal bleeding.  Return to ER if you develop any dizziness, shortness of breath, loss of consciousness, black stools, any other new or concerning symptoms.

## 2023-01-24 NOTE — ED Provider Notes (Signed)
[place] EMERGENCY DEPARTMENT AT [facility] Provider Note   CSN: [id number] Arrival date & time: 01/24/23  1101     History  Chief Complaint  Patient presents with   Abdominal Pain   Rectal Bleeding    Audrey Pratt is a 72 y.o. female followed by GI for rectal bleeding due to hemorrhoids, presents with concern for an episode of bloody stool this morning.  States the blood was bright red.  She also states some left lower quadrant cramping associated with this.  Denies any further rectal bleeding after the bowel movement.  Denies any dizziness or shortness of breath.  She is not on any blood thinners.  Does have a history of hemorrhoids and has undergone banding procedures.  Recently underwent colonoscopy where she was told she had diverticulosis.  Currently followed by Dr. Chales Abrahams with GI.   Abdominal Pain Associated symptoms: hematochezia   Rectal Bleeding Associated symptoms: abdominal pain        Home Medications Prior to Admission medications   Medication Sig Start Date End Date Taking? Authorizing Provider  lidocaine (XYLOCAINE) 5 % ointment Apply 1 Application topically as needed. 01/24/23  Yes Arabella Merles, PA-C  Calcium Carbonate+Vitamin D 600-200 MG-UNIT TABS 1 tablet with a meal    [provider]  cetirizine (ZYRTEC) 10 MG tablet Zyrtec 10 mg tablet  Take 1 tablet every day by oral route.    [provider]  Cholecalciferol (VITAMIN D3 PO) Take 1 tablet by mouth daily in the afternoon.    [provider]  docusate (COLACE) 50 MG/5ML liquid Take by mouth daily.    [provider]  fluocinonide cream (LIDEX) 0.05 % 1 Application as needed. 05/31/18   [provider]  fluticasone (CUTIVATE) 0.05 % cream Apply topically 2 (two) times daily. For 10 days and then use as needed 03/19/22   Lynann Bologna, MD  fluticasone [facility]) 50 MCG/ACT nasal spray Place 2 sprays into both nostrils daily.    [provider]  hydrocortisone (ANUSOL-HC) 2.5 % rectal cream Place 1 Application rectally 3 (three) times daily. Patient not taking: Reported on 10/15/2022 10/15/21   Zehr, Princella Pellegrini, PA-C  hydrocortisone (ANUSOL-HC) 25 MG suppository Place 1 suppository (25 mg total) rectally 2 (two) times daily. Hydrocortisone suppository 25 mg twice daily per rectum for 10 days. 11/25/22   Lynann Bologna, MD  lidocaine (LIDODERM) 5 % Place 1 patch onto the skin daily. 07/29/22   [provider]  linaclotide (LINZESS) 290 MCG CAPS capsule Take 1 capsule by mouth every other day.    [provider]  meloxicam (MOBIC) 15 MG tablet Take 1 tablet by mouth as needed. Patient not taking: Reported on 10/15/2022 01/01/21   [provider]  Multiple Vitamins-Minerals (CENTRUM SILVER ULTRA WOMENS) TABS See admin instructions.    [provider]  omeprazole (PRILOSEC) 20 MG capsule Take 20 mg by mouth daily. Takes 30 minutes before breakfast    [provider]  PROLIA 60 MG/ML SOSY injection Inject 60 mg into the skin every 6 (six) months. 06/13/21   [provider]  triamcinolone ointment (KENALOG) 0.1 % Apply 1 Application topically daily.    [provider]  Wheat Dextrin (BENEFIBER PO) Take 1-2 tablets by mouth as needed.    [provider]      Allergies    Pneumococcal vac polyvalent and Latex    Review of Systems   Review of Systems  Gastrointestinal:  Positive for  abdominal pain and hematochezia.    Physical Exam Updated Vital Signs BP 136/63   Pulse 83   Temp 98.3 F (36.8 C)   Resp 16   Ht 5\' 7"  (1.702 m)   Wt 67.1 kg   SpO2 100%   BMI 23.18 kg/m  Physical Exam Vitals and nursing note reviewed. Exam conducted with a chaperone present.  Constitutional:      General: She is not in acute distress.    Appearance: She is well-developed.  HENT:     Head: Normocephalic and atraumatic.  Eyes:     Conjunctiva/sclera: Conjunctivae  normal.  Cardiovascular:     Rate and Rhythm: Normal rate and regular rhythm.     Heart sounds: No murmur heard. Pulmonary:     Effort: Pulmonary effort is normal. No respiratory distress.     Breath sounds: Normal breath sounds.  Abdominal:     Palpations: Abdomen is soft.     Tenderness: There is no abdominal tenderness.     Comments: Abdomen soft and non-tender to palpation  Genitourinary:    Comments: Large non-thrombosed external hemorrhoids, no internal hemorrhoids appreciated. Stool brown appearing, no active rectal bleeding Musculoskeletal:        General: No swelling.     Cervical back: Neck supple.  Skin:    General: Skin is warm and dry.     Capillary Refill: Capillary refill takes less than 2 seconds.  Neurological:     Mental Status: She is alert.  Psychiatric:        Mood and Affect: Mood normal.     ED Results / Procedures / Treatments   Labs (all labs ordered are listed, but only abnormal results are displayed) Labs Reviewed  COMPREHENSIVE METABOLIC PANEL - Abnormal; Notable for the following components:      Result Value   Glucose, Bld 109 (*)    Total Protein 6.0 (*)    Alkaline Phosphatase 35 (*)    All other components within normal limits  CBC - Abnormal; Notable for the following components:   Platelets 147 (*)    All other components within normal limits  URINALYSIS, ROUTINE W REFLEX MICROSCOPIC - Abnormal; Notable for the following components:   Hgb urine dipstick SMALL (*)    All other components within normal limits  POC OCCULT BLOOD, ED - Abnormal; Notable for the following components:   Fecal Occult Bld POSITIVE (*)    All other components within normal limits  TYPE AND SCREEN    EKG None  Radiology CT ABDOMEN PELVIS W CONTRAST  Result Date: 01/24/2023 CLINICAL DATA:  Left lower quadrant pain for a couple days. Bright red blood per rectum. History of diverticulosis. EXAM: CT ABDOMEN AND PELVIS WITH CONTRAST TECHNIQUE: Multidetector CT  imaging of the abdomen and pelvis was performed using the standard protocol following bolus administration of intravenous contrast. RADIATION DOSE REDUCTION: This exam was performed according to the departmental dose-optimization program which includes automated exposure control, adjustment of the mA and/or kV according to patient size and/or use of iterative reconstruction technique. CONTRAST:  75mL OMNIPAQUE IOHEXOL 350 MG/ML SOLN COMPARISON:  10/02/2020 FINDINGS: Lower chest: 3-4 mm right lower lobe pulmonary nodule is unchanged compared to 10/02/2020 and considered benign. Normal heart size without pericardial or pleural effusion. Tiny hiatal hernia. Hepatobiliary: Hepatic cysts. A segment 7 2.5 cm hemangioma is again identified. Normal gallbladder, without biliary ductal dilatation. Pancreas: Normal, without mass or ductal dilatation. Spleen: Normal in size, without focal abnormality. Adrenals/Urinary Tract: Normal  adrenal glands. Interpolar left renal too small to characterize lesion is most likely a cyst . In the absence of clinically indicated signs/symptoms require(s) no independent follow-up. Normal right kidney. No hydronephrosis. Normal urinary bladder. Stomach/Bowel: Proximal gastric underdistention. Subtle hyperenhancement involving the low rectum/anus including on 84 and 85 of series 3. Scattered colonic diverticula. Normal terminal ileum and appendix. Normal small bowel. Vascular/Lymphatic: Aortic atherosclerosis. No abdominopelvic adenopathy. Reproductive: Hysterectomy.  No adnexal mass. Other: No significant free fluid. Mild pelvic floor laxity. No free intraperitoneal air. Musculoskeletal: Lumbosacral spondylosis.  Disc bulge at L4-5. IMPRESSION: 1. Subtle hyperenhancement within the distal rectum/anus, nonspecific. Given reported recent negative colonoscopy, considerations include proctitis or hemorrhoids. Rectal mass cannot be excluded based on the this study and physical exam correlation, as  well as confirmation of prior negative recent colonoscopy recommended. 2. No other explanation for patient's symptoms. 3. Incidental findings, including: Aortic Atherosclerosis (ICD10-I70.0). Tiny hiatal hernia. Electronically Signed   By: Jeronimo Greaves M.D.   On: 01/24/2023 15:13    Procedures Procedures    Medications Ordered in ED Medications  iohexol (OMNIPAQUE) 350 MG/ML injection 75 mL (75 mLs Intravenous Contrast Given 01/24/23 1410)    ED Course/ Medical Decision Making/ A&P                                 Medical Decision Making Amount and/or Complexity of Data Reviewed Labs: ordered. Radiology: ordered.  Risk Prescription drug management.     Differential diagnosis includes but is not limited to hemorrhoid, diverticulitis, upper GI bleed, anemia  ED Course:  Patient overall well-appearing, stable vital signs.  She is not on any abdominal tenderness upon my exam.  Her CBC is without any anemia, no leukocytosis.  CMP without any electrolyte abnormalities, no elevation LFTs. Low concern for any acute abdominal pathology.  However, given report of left lower quadrant pain and history of diverticulosis, there is concern for possible diverticulitis.  Rectal exam performed with chaperone present which revealed large external hemorrhoids, nonthrombosed.  No active rectal bleeding.  Stool appeared brown, but Hemoccult positive.  No melanotic stool.  Obtain CT abdomen pelvis which showed no acute abnormalities. The read revealed slight hyper enhancement at distal rectum concerning for proctitis given recent colonoscopy or hemorrhoid. Could not exclude rectal mass, but given recent colonoscopy without any acute findings and rectal exam without palpation of mass, think this is less likely. Will have patient follow up with Dr. Chales Abrahams with GI.   Impression: Rectal bleed secondary to hemorrhoids  Disposition:  The patient was discharged home with instructions to follow-up with Dr.  Chales Abrahams with GI.  Continue with bowel regimen. Return precautions given.  Lab Tests: I Ordered, and personally interpreted labs.  The pertinent results include:    CBC without leukocytosis, no anemia CMP with no electrolyte abnormalities, no elevation FTs  Imaging Studies ordered: I ordered imaging studies including CT abdomen pelvis  I independently visualized the imaging with scope of interpretation limited to determining acute life threatening conditions related to emergency care. Imaging showed no acute abnormalities I agree with the radiologist interpretation    External records from outside source obtained and reviewed including Dr. Chales Abrahams visit from 01/11/23 where negative colonoscopy from 9/24 was discussed and he recommended bowel regimen of linzess and colace daily              Final Clinical Impression(s) / ED Diagnoses Final diagnoses:  Rectal bleeding  Rx / DC Orders ED Discharge Orders          Ordered    lidocaine (XYLOCAINE) 5 % ointment  As needed,   Status:  Discontinued        01/24/23 1527    lidocaine (XYLOCAINE) 5 % ointment  As needed        01/24/23 1538              Arabella Merles, PA-C 01/24/23 1540    Anders Simmonds T, DO 01/24/23 2015

## 2023-02-01 ENCOUNTER — Telehealth: Payer: Self-pay | Admitting: Gastroenterology

## 2023-02-01 NOTE — Telephone Encounter (Signed)
Inbound call from patient stating she was at the ED for rectal bleeding. Wishing to speak with a nurse. States she is doing better. Patient requesting a call back. Please advise, thank you.

## 2023-02-01 NOTE — Telephone Encounter (Signed)
Pt stated that she went to the ED last week and they notified her that she had an internal hemorrhoid that had burst. Pt stated that the bleeding has stopped and that she is doing sitz bath and feeling better.

## 2023-03-29 ENCOUNTER — Emergency Department (HOSPITAL_COMMUNITY)
Admission: EM | Admit: 2023-03-29 | Discharge: 2023-03-30 | Disposition: A | Discharge status: Home / Self Care | Payer: Medicare PPO | Attending: Emergency Medicine | Admitting: Emergency Medicine

## 2023-03-29 ENCOUNTER — Encounter (HOSPITAL_COMMUNITY): Payer: Self-pay | Admitting: Emergency Medicine

## 2023-03-29 ENCOUNTER — Emergency Department (HOSPITAL_COMMUNITY): Payer: Medicare PPO

## 2023-03-29 ENCOUNTER — Other Ambulatory Visit: Payer: Self-pay

## 2023-03-29 DIAGNOSIS — R109 Unspecified abdominal pain: Secondary | ICD-10-CM | POA: Diagnosis present

## 2023-03-29 DIAGNOSIS — K625 Hemorrhage of anus and rectum: Secondary | ICD-10-CM | POA: Insufficient documentation

## 2023-03-29 LAB — LIPASE, BLOOD: Lipase: 27 U/L (ref 11–51)

## 2023-03-29 LAB — COMPREHENSIVE METABOLIC PANEL
ALT: 14 U/L (ref 0–44)
AST: 18 U/L (ref 15–41)
Albumin: 4 g/dL (ref 3.5–5.0)
Alkaline Phosphatase: 31 U/L — ABNORMAL LOW (ref 38–126)
Anion gap: 9 (ref 5–15)
BUN: 8 mg/dL (ref 8–23)
CO2: 27 mmol/L (ref 22–32)
Calcium: 9.2 mg/dL (ref 8.9–10.3)
Chloride: 106 mmol/L (ref 98–111)
Creatinine, Ser: 0.73 mg/dL (ref 0.44–1.00)
GFR, Estimated: >60 mL/min (ref >60)
Glucose, Bld: 108 mg/dL — ABNORMAL HIGH (ref 70–99)
Potassium: 4.1 mmol/L (ref 3.5–5.1)
Sodium: 142 mmol/L (ref 135–145)
Total Bilirubin: 0.8 mg/dL (ref 0.0–1.2)
Total Protein: 5.9 g/dL — ABNORMAL LOW (ref 6.5–8.1)

## 2023-03-29 LAB — CBC
HCT: 38.3 % (ref 36.0–46.0)
Hemoglobin: 13.3 g/dL (ref 12.0–15.0)
MCH: 29.5 pg (ref 26.0–34.0)
MCHC: 34.7 g/dL (ref 30.0–36.0)
MCV: 84.9 fL (ref 80.0–100.0)
Platelets: 157 10*3/uL (ref 150–400)
RBC: 4.51 MIL/uL (ref 3.87–5.11)
RDW: 13.3 % (ref 11.5–15.5)
WBC: 4.8 10*3/uL (ref 4.0–10.5)
nRBC: 0 % (ref 0.0–0.2)

## 2023-03-29 LAB — URINALYSIS, ROUTINE W REFLEX MICROSCOPIC
Bilirubin Urine: NEGATIVE
Glucose, UA: NEGATIVE mg/dL
Hgb urine dipstick: NEGATIVE
Ketones, ur: NEGATIVE mg/dL
Leukocytes,Ua: NEGATIVE
Nitrite: NEGATIVE
Protein, ur: NEGATIVE mg/dL
Specific Gravity, Urine: 1.005 (ref 1.005–1.030)
pH: 6 (ref 5.0–8.0)

## 2023-03-29 LAB — HEMOGLOBIN AND HEMATOCRIT, BLOOD
HCT: 37.3 % (ref 36.0–46.0)
Hemoglobin: 12.8 g/dL (ref 12.0–15.0)

## 2023-03-29 LAB — POC OCCULT BLOOD, ED: Fecal Occult Bld: NEGATIVE

## 2023-03-29 MED ORDER — IOHEXOL 350 MG/ML SOLN
75.0000 mL | Freq: Once | INTRAVENOUS | Status: AC | PRN
  Administered 2023-03-29: 75 mL via INTRAVENOUS

## 2023-03-29 NOTE — ED Provider Notes (Signed)
  EMERGENCY DEPARTMENT AT [facility] Provider Note  MDM   HPI/ROS:  Audrey Pratt is a 73 y.o. female with pertinent past medical history of hemorrhoids s/p banding x 3, IBS-C, who presents with abdominal pain and rectal bleeding.  Patient reports a longstanding history of intermittent rectal bleeding but she states that it has been more persistent than normal for the past 3 days.  She states she has had multiple episodes of bright red blood with small clots followed by looser than normal stool.  She notes that her abdominal pain subsided slightly after these episodes.  She was started on ciprofloxacin and Flagyl 3 days ago by her PCP, however she states that she has only been taking these intermittently because she was concerned about potential medication interactions.  She denies fevers, chest pain, dyspnea, nausea, vomiting, dysuria, hematuria.  She has not recently been sexually active.  She does not take any anticoagulants.   Physical exam is notable for: - Numerous external hemorrhoids with small amount of light brown guaiac-negative stool -- Benign abdominal exam  On my initial evaluation, patient is:  -Vital signs stable. Patient afebrile, hemodynamically stable, and non-toxic appearing. -Additional history obtained from review of prior records, including recent overall unremarkable colonoscopy in 11/2022  This patient's current presentation, including their history and physical exam, is most consistent with. Differentials include diverticulitis, hemorrhoids, anal fissure, proctitis, colitis, unlikely mesenteric ischemia.  Initial labs obtained in triage with stable hemoglobin from prior at 13.3, normal platelets, normal creatinine and electrolytes, normal lipase.  UA additionally was without evidence of blood or infection.  Given benign abdominal exam overall lower suspicion for significant intra-abdominal pathology including complicated diverticulitis (abscess,  perforation) however plan to obtain formal CT abdomen/pelvis to better evaluate.  Interpretations, interventions, and the patient's course of care are documented below.   -CT without evidence of colitis or diverticulitis.  Does note high density material within the cecum and ascending colon favored to represent ingested material however unable to exclude active GI bleed.  Discussed this read with radiologist who advised only way to fully evaluate is combined CT without contrast and CTA, however patient already has prior contrast load on board which could remain in system for up to 24 hours -CT also with moderate stool burden - Patient was reevaluated and confirmed she does not take any Pepto-Bismol or other bismuth containing products.   -Abdominal exam continues to be benign  - patient has not had any episodes of rectal bleeding in nearly 10 hours in the ED - Repeat hemoglobin/hematocrit obtained and grossly stable at 12.8/37.3% -Engaged in shared decision with patient and daughter at bedside regarding very low clinical concern for active GI bleed given the above.  Patient does not want to be admitted to wait for repeat CT or GI evaluation and is comfortable with discharge home -Patient advised to contact both PCP and GI tomorrow regarding ED presentation and workup, and strict return precautions were provided   Disposition:  I discussed the plan for discharge with the patient and/or their surrogate at bedside prior to discharge and they were in agreement with the plan and verbalized understanding of the return precautions provided. All questions answered to the best of my ability. Ultimately, the patient was discharged in stable condition with stable vital signs. I am reassured that they are capable of close follow up and good social support at home.   Clinical Impression:  1. Rectal bleeding     Rx / DC Orders ED  Discharge Orders     None       The plan for this patient was discussed  with Dr. Freddi, who voiced agreement and who oversaw evaluation and treatment of this patient.    Clinical Complexity A medically appropriate history, review of systems, and physical exam was performed.  My independent interpretations of EKG, labs, and radiology are documented in the ED course above.   If decision rules were used in this patient's evaluation, they are listed below.   Patient's presentation is most consistent with acute presentation with potential threat to life or bodily function.  Medical Decision Making Amount and/or Complexity of Data Reviewed Labs: ordered. Radiology: ordered.  Risk Prescription drug management.    HPI/ROS      See MDM section for pertinent HPI and ROS. A complete ROS was performed with pertinent positives/negatives noted above.   Past Medical History:  Diagnosis Date   Allergy    Arthritis    GERD (gastroesophageal reflux disease)     Past Surgical History:  Procedure Laterality Date   BREAST LUMPECTOMY Right    COLONOSCOPY  06/25/2015   every 5 years   HAND TENDON SURGERY Right    PARTIAL HYSTERECTOMY     ROTATOR CUFF REPAIR Right       Physical Exam   Vitals:   03/29/23 1313 03/29/23 1340  BP: 125/63   Pulse: 73   Resp: 17   Temp: 98.6 F (37 C)   TempSrc: Oral   SpO2: 99%   Weight:  64.9 kg  Height:  5' 7 (1.702 m)    Physical Exam Gen: NAD. Appears comfortable HENT: Conjunctiva clear, PERRL, EOMI. MMM.  CV: RRR. No M/R/G Pulm: Lungs CTAB with no wheezing, rales, or rhonchi.  GI: Abdomen soft, non-tender, non-distended. Normal bowel sounds in all 4 quadrants. Rectal: External hemorrhoids noted.  Small amount of light brown stool in rectal vault.  No anal fissures noted and no abscesses palpated.  No tenderness. MSK/Skin: No lower extremity edema. Extremities warm, well-perfused with 2+ pulses in all 4 extremities. Neuro: A&Ox3. GCS 15. Moves all extremities.     Procedures   If procedures were  preformed on this patient, they are listed below:  Procedures   Laverda Rimes, MD Emergency Medicine PGY-2   Please note that this documentation was produced with the assistance of voice-to-text technology and may contain errors.    Rimes Laverda, MD 03/30/23 [id number]    Freddi Hamilton, MD 04/02/23 [phone]

## 2023-03-29 NOTE — ED Triage Notes (Addendum)
 Pt via POV c/o rectal bleeding for the past 3 days with BM. LLQ abdominal pain rated 8/10 with radiation to left flank. Pt has had diarrhea but denies n/v/fever. Hx diverticulitis with similar presentation.

## 2023-03-29 NOTE — ED Provider Triage Note (Signed)
 Emergency Medicine Provider Triage Evaluation Note  Audrey Pratt , Pratt 73 y.o. female  was evaluated in triage.  Pt complains of abdominal pain.  Pain left lower quadrant with rectal bleeding x 3 days.  History of diverticulitis and hemorrhoids, she is unsure which is the cause of her symptoms today.  Denies nausea or vomiting.  Review of Systems  Positive:  Negative:   Physical Exam  BP 125/63 (BP Location: Right Arm)   Pulse 73   Temp 98.6 F (37 C) (Oral)   Resp 17   Ht 5' 7 (1.702 m)   Wt 64.9 kg   SpO2 99%   BMI 22.40 kg/m  Gen:   Awake, no distress   Resp:  Normal effort  MSK:   Moves extremities without difficulty  Other:    Medical Decision Making  Medically screening exam initiated at 2:15 PM.  Appropriate orders placed.  Audrey Pratt was informed that the remainder of the evaluation will be completed by another provider, this initial triage assessment does not replace that evaluation, and the importance of remaining in the ED until their evaluation is complete.  Workup initiated   Audrey Tobon A, PA-C 03/29/23 1416

## 2023-03-29 NOTE — Discharge Instructions (Addendum)
 You were seen in the emergency department for rectal bleeding and abdominal pain.  Test performed while you are here include blood work, CT scan.  Your CT scan showed some high density material in your bowel favored to be ingested material, and much less likely due to bleeding.  We obtained repeat blood counts which were stable and you had no further episodes of rectal bleeding while in the emergency department.  Please contact your PCP and your GI physicians tomorrow regarding your ED visit in order to obtain close follow-up.  If you are having ongoing symptoms they may want to obtain repeat CT scan both with and without contrast. If you experience significantly increased abdominal pain, recurrent and worsening rectal bleeding, loss of consciousness, or any other concerns, return to the ED for reevaluation.

## 2023-03-30 ENCOUNTER — Telehealth: Payer: Self-pay | Admitting: Gastroenterology

## 2023-03-30 MED ORDER — HYDROCORTISONE ACETATE 25 MG RE SUPP: 25.0000 mg | Freq: Two times a day (BID) | RECTAL | 2 refills | Status: DC

## 2023-03-30 NOTE — ED Notes (Signed)
 Patient verbalizes understanding of discharge instructions. Opportunity for questioning and answers were provided. Armband removed by staff, pt discharged from ED. Pt taken to ED entrance via wheel chair.

## 2023-03-30 NOTE — Telephone Encounter (Signed)
 Called and spoke with patient regarding recommendations as outlined below. Pt has been advised that RX for Anusol  suppositories has been sent to her pharmacy. Pt is aware that RX may not be covered since she had an issue getting medication previously. Pt will let us  know if she has any issues, at that time would recommend Preparation H suppository with hydrocortisone  cream added to tip. Pt will begin Miralax BID until successful BM, then once daily. Pt will let us  know if she has continued symptoms despite recommendations. If so, referral will be placed to [facility]. Patient verbalized understanding and had no concerns at the end of the call.

## 2023-03-30 NOTE — Telephone Encounter (Signed)
 Dr. Chales Abrahams, will you review hospital notes and advise? Thanks

## 2023-03-30 NOTE — Telephone Encounter (Signed)
 Inbound call from patient stating that she was seen in the ER for rectal bleeding. She was advised to give our office a call first thing in the morning to follow up with us  and to discuss scheduling a CT with contrast and without contrast. Patient has already had CT with contrast  while at the ER. Patient is requesting a call back to discuss. Please advise.

## 2023-03-30 NOTE — Telephone Encounter (Signed)
 CT Abdo/pelvis with contrast reviewed from yesterday-high density material is likely due to ingestion. Mod stool She had normal CBC Recent colonoscopy 11/2022: neg  Internal hemorrhoids  She likely had hemorrhoidal bleeding S/P banding x 3  Plan: -MiraLAX 17 g p.o. twice daily until a large bowel movement, then once a day -Anusol  HC suppositories 1 twice daily after the bowel movement for 7 to 10 days, 2 refills -If still with problems, surgical consultation for EUA/hemorrhoidectomy.  RG

## 2023-04-08 ENCOUNTER — Ambulatory Visit
Admission: RE | Admit: 2023-04-08 | Discharge: 2023-04-08 | Disposition: A | Discharge status: Home / Self Care | Payer: Medicare PPO | Source: Ambulatory Visit | Attending: Internal Medicine | Admitting: Internal Medicine

## 2023-04-08 DIAGNOSIS — R921 Mammographic calcification found on diagnostic imaging of breast: Secondary | ICD-10-CM

## 2023-05-31 ENCOUNTER — Ambulatory Visit
Admission: RE | Admit: 2023-05-31 | Discharge: 2023-05-31 | Disposition: A | Discharge status: Home / Self Care | Payer: Medicare PPO | Source: Ambulatory Visit | Attending: Internal Medicine | Admitting: Internal Medicine

## 2023-05-31 DIAGNOSIS — M810 Age-related osteoporosis without current pathological fracture: Secondary | ICD-10-CM

## 2023-06-18 ENCOUNTER — Telehealth: Payer: Self-pay | Admitting: Gastroenterology

## 2023-06-18 DIAGNOSIS — R1032 Left lower quadrant pain: Secondary | ICD-10-CM

## 2023-06-18 MED ORDER — DICYCLOMINE HCL 20 MG PO TABS: 20.0000 mg | ORAL_TABLET | Freq: Three times a day (TID) | ORAL | 0 refills | Status: DC | PRN

## 2023-06-18 MED ORDER — AMOXICILLIN-POT CLAVULANATE 875-125 MG PO TABS: 1.0000 | ORAL_TABLET | Freq: Two times a day (BID) | ORAL | 0 refills | Status: DC

## 2023-06-18 NOTE — Telephone Encounter (Signed)
 Ct 03/2023  IMPRESSION: 1. High-density material within the cecum and ascending colon, indeterminate for active GI bleed versus ingested material such as bismuth containing products. Ingested material is favored as there is similar high-density material in the appendix. 2. Moderate volume of formed stool in the colon. 3. Scattered colonic diverticulosis without diverticulitis.  Patient had a negative colonoscopy 11/2022  Suggest continuing Linzess, continue Colace, can do low fiber-diet, will send in Augmentin for possible diverticulitis, needs follow-up in the office. - get CBC, CMET, and sed rate. -IBGARD daily, will give Bentyl as needed, heating pad and liquid diet. - ER precautions  -close follow up 4-6 weeks

## 2023-06-18 NOTE — Telephone Encounter (Signed)
 Patient called in with concerns for diverticulitis flare. Intermittent, LLQ (6/10 pain) that started 2 days go. She did have two small episodes of rectal bleeding as well d/t straining, also has history of hemorrhoids. Bleeding has resolved. Denies any n/v/fever. Last BM today. She's using a heating pad for comfort. Currently on Linzess 290 daily. Last seen with Dr. Chales Abrahams 12/2022.

## 2023-06-18 NOTE — Telephone Encounter (Signed)
 Pt made aware of PA recommendations. She will come by Monday for labs. F/U scheduled for 07/27/23 at 3:00 pm with Marchelle Folks, [place]. Pt had no further questions & verbalized all understanding.

## 2023-06-18 NOTE — Telephone Encounter (Signed)
 Patient called and stated that she was needing to speak to the nurse because she believes she is having a Diverticulitis Flare up and was wondering what she needs to do what she should take. Patient is requesting a call back from the nurse today if possible. Please advise.

## 2023-06-21 ENCOUNTER — Other Ambulatory Visit (INDEPENDENT_AMBULATORY_CARE_PROVIDER_SITE_OTHER)

## 2023-06-21 DIAGNOSIS — R1032 Left lower quadrant pain: Secondary | ICD-10-CM | POA: Diagnosis not present

## 2023-06-21 LAB — CBC WITH DIFFERENTIAL/PLATELET
Basophils Absolute: 0 10*3/uL (ref 0.0–0.1)
Basophils Relative: 0.6 % (ref 0.0–3.0)
Eosinophils Absolute: 0 10*3/uL (ref 0.0–0.7)
Eosinophils Relative: 0.7 % (ref 0.0–5.0)
HCT: 40.7 % (ref 36.0–46.0)
Hemoglobin: 13.7 g/dL (ref 12.0–15.0)
Lymphocytes Relative: 28.6 % (ref 12.0–46.0)
Lymphs Abs: 1.3 10*3/uL (ref 0.7–4.0)
MCHC: 33.6 g/dL (ref 30.0–36.0)
MCV: 86.9 fl (ref 78.0–100.0)
Monocytes Absolute: 0.4 10*3/uL (ref 0.1–1.0)
Monocytes Relative: 8.7 % (ref 3.0–12.0)
Neutro Abs: 2.7 10*3/uL (ref 1.4–7.7)
Neutrophils Relative %: 61.4 % (ref 43.0–77.0)
Platelets: 156 10*3/uL (ref 150.0–400.0)
RBC: 4.68 Mil/uL (ref 3.87–5.11)
RDW: 13.5 % (ref 11.5–15.5)
WBC: 4.5 10*3/uL (ref 4.0–10.5)

## 2023-06-21 LAB — COMPREHENSIVE METABOLIC PANEL WITH GFR
ALT: 10 U/L (ref 0–35)
AST: 15 U/L (ref 0–37)
Albumin: 4.8 g/dL (ref 3.5–5.2)
Alkaline Phosphatase: 40 U/L (ref 39–117)
BUN: 10 mg/dL (ref 6–23)
CO2: 30 meq/L (ref 19–32)
Calcium: 9.3 mg/dL (ref 8.4–10.5)
Chloride: 105 meq/L (ref 96–112)
Creatinine, Ser: 0.8 mg/dL (ref 0.40–1.20)
GFR: 72.71 mL/min (ref >60.00)
Glucose, Bld: 81 mg/dL (ref 70–99)
Potassium: 3.9 meq/L (ref 3.5–5.1)
Sodium: 141 meq/L (ref 135–145)
Total Bilirubin: 0.6 mg/dL (ref 0.2–1.2)
Total Protein: 6.7 g/dL (ref 6.0–8.3)

## 2023-06-21 LAB — SEDIMENTATION RATE: Sed Rate: 1 mm/h (ref 0–30)

## 2023-07-27 ENCOUNTER — Telehealth: Payer: Self-pay

## 2023-07-27 ENCOUNTER — Other Ambulatory Visit (HOSPITAL_COMMUNITY): Payer: Self-pay

## 2023-07-27 ENCOUNTER — Ambulatory Visit: Admitting: Physician Assistant

## 2023-07-27 ENCOUNTER — Encounter: Payer: Self-pay | Admitting: Physician Assistant

## 2023-07-27 VITALS — BP 124/78 | HR 70 | Ht 67.0 in | Wt 149.0 lb

## 2023-07-27 DIAGNOSIS — R1032 Left lower quadrant pain: Secondary | ICD-10-CM | POA: Diagnosis not present

## 2023-07-27 DIAGNOSIS — K649 Unspecified hemorrhoids: Secondary | ICD-10-CM | POA: Diagnosis not present

## 2023-07-27 DIAGNOSIS — K581 Irritable bowel syndrome with constipation: Secondary | ICD-10-CM

## 2023-07-27 DIAGNOSIS — K625 Hemorrhage of anus and rectum: Secondary | ICD-10-CM

## 2023-07-27 DIAGNOSIS — Z800 Family history of malignant neoplasm of digestive organs: Secondary | ICD-10-CM

## 2023-07-27 DIAGNOSIS — K219 Gastro-esophageal reflux disease without esophagitis: Secondary | ICD-10-CM | POA: Diagnosis not present

## 2023-07-27 DIAGNOSIS — K602 Anal fissure, unspecified: Secondary | ICD-10-CM | POA: Diagnosis not present

## 2023-07-27 MED ORDER — HYDROCORTISONE (PERIANAL) 2.5 % EX CREA: 1.0000 | TOPICAL_CREAM | Freq: Three times a day (TID) | CUTANEOUS | 1 refills | Status: AC

## 2023-07-27 NOTE — Telephone Encounter (Signed)
 Pharmacy Patient Advocate Encounter   Received notification from CoverMyMeds that prior authorization for Procto-Med HC  2.5% cream is required/requested.   Insurance verification completed.   The patient is insured through [place] .   Per test claim:  Generic Hydrocortisone  ** is preferred by the insurance.  If suggested medication is appropriate, Please send in a new RX and discontinue this one. If not, please advise as to why it's not appropriate so that we may request a Prior Authorization. Please note, some preferred medications may still require a PA.  If the suggested medications have not been trialed and there are no contraindications to their use, the PA will not be submitted, as it will not be approved.   Quantity 30g per 12 days is co-pay of $3.88

## 2023-07-27 NOTE — Patient Instructions (Addendum)
 Anal Fissure, Adult  If the hemorrhoid suppository sent in is too expensive you can do this over the counter trick.  Apply a pea size amount of generic prescription Anusol  HC cream that has been sent into your pharmacy to the tip of an over the counter PrepH suppository and insert rectally once every night for at least 7 nights.    A fissure is a linear defect in the anal mucosa, symptoms include burning, itching, discomfort especially with a bowel movement with associated rectal bleeding.  Risk factors include low fiber diet, chronic constipation and straining. Anal fissures can take a very long time to heal so this will be a 2 to 45-month process.  Treatment for a fissure includes:  -decreasing time in the toilet should not be more than 5 minutes -adding fiber supplement such as Benefiber or Citrucel -increasing water. -There is a lubricating suppository over-the-counter called Calmol-4 you can get from [place] [facility] or from your pharmacy (may have to order) that I want to do twice daily morning and evening for 6-8 weeks.  This is a 60 to 80% success rate.  Linzess  *IBS-C patients may begin to experience relief from belly pain and overall abdominal symptoms (pain, discomfort, and bloating) in about 1 week,  with symptoms typically improving over 12 weeks.  Take at least 30 minutes before the first meal of the day on an empty stomach You can have a loose stool if you eat a high-fat breakfast. Give it at least 7 days, may have more bowel movements during that time.   The diarrhea should go away and you should start having normal, complete, full bowel movements.  It may be helpful to start treatment when you can be near the comfort of your own bathroom, such as a weekend.  After you are out we can send in a prescription if you did well, there is a prescription card  Miralax is an osmotic laxative.  It only brings more water into the stool.  This is safe to take daily.  Can take up to 17 gram of  miralax twice a day.  Mix with juice or coffee.  Start 1 capful at night for 3-4 days and reassess your response in 3-4 days.  You can increase and decrease the dose based on your response.  Remember, it can take up to 3-4 days to take effect OR for the effects to wear off.   I often pair this with benefiber in the morning to help assure the stool is not too loose.   Toileting tips to help with your constipation - Drink at least 64-80 ounces of water/liquid per day. - Establish a time to try to move your bowels every day.  For many people, this is after a cup of coffee or after a meal such as breakfast. - Sit all of the way back on the toilet keeping your back fairly straight and while sitting up, try to rest the tops of your forearms on your upper thighs.   - Raising your feet with a step stool/squatty potty can be helpful to improve the angle that allows your stool to pass through the rectum. - Relax the rectum feeling it bulge toward the toilet water.  If you feel your rectum raising toward your body, you are contracting rather than relaxing. - Breathe in and slowly exhale. "Belly breath" by expanding your belly towards your belly button. Keep belly expanded as you gently direct pressure down and back to the anus.  A  low pitched GRRR sound can assist with increasing intra-abdominal pressure.  (Can also trying to blow on a pinwheel and make it move, this helps with the same belly breathing) - Repeat 3-4 times. If unsuccessful, contract the pelvic floor to restore normal tone and get off the toilet.  Avoid excessive straining. - To reduce excessive wiping by teaching your anus to normally contract, place hands on outer aspect of knees and resist knee movement outward.  Hold 5-10 second then place hands just inside of knees and resist inward movement of knees.  Hold 5 seconds.  Repeat a few times each way.  First do a trial off milk/lactose products if you use them.  Add fiber like benefiber or  citracel once a day Increase activity Can do trial of IBGard which is over the counter for AB pain- Take 1-2 capsules once a day for maintence or twice a day during a flare For IBS and peppermint oil.  Peppermint oil has been proven to be better than placebo for cramping for IBS Stop if it worsens heart burn or causes flushing of your face.  Ideally enteric coated peppermint oil capsules 2 a day is best but if you got the oil, you can use 0.59ml or 180 mg of pepperment oil up to 3 x a day.       FODMAP stands for fermentable oligo-, di-, mono-saccharides and polyols (1). These are the scientific terms used to classify groups of carbs that are difficult for our body to digest and that are notorious for triggering digestive symptoms like bloating, gas, loose stools and stomach pain.   You can try low FODMAP diet  - start with eliminating just one column at a time that you feel may be a trigger for you. - the table at the very bottom contains foods that are low in FODMAPs   Sometimes trying to eliminate the FODMAP's from your diet is difficult or tricky, if you are stuggling with trying to do the elimination diet you can try an enzyme.  There is a food enzymes that you sprinkle in or on your food that helps break down the FODMAP. You can read more about the enzyme by going to this site: https://fodzyme.com/

## 2023-07-27 NOTE — Progress Notes (Signed)
 07/27/2023 DEETTE DULWORTH [id number] May 18, 1950  Referring provider: Georgean Kindle, MD Primary GI doctor: Dr. Venice Gillis  ASSESSMENT AND PLAN:   Left lower quadrant abdominal discomfort Patient called 06/18/23 with possible diverticulitis had previous CT abdomen pelvis with contrast 03/2023 moderate volume formed stool and scattered diverticulosis without diverticulitis prescribed Augmentin , had labs without leukocytosis normal kidney liver and negative sed rate Symptoms improved with augmentin  No fever, chills, better  Uncertain diverticulitis versus IBS-C, if she has return of symptoms suggest stat CT to confirm diverticulitis - add on fiber - given information  IBS constipation On Linzess 290 and Colace, has BM daily but can feel incomplete Moderate formed stool with CT abdomen pelvis in January -Can do trial of IBGARD daily -FODMAP,  and lifestyle changes discussed -possible component of pelvis floor dysfunction with history and symptoms.  -Will treat with miralax/fiber, squatty potty. May have to add on stimulant.  -Can refer to pelvic floor PT -Consider SIBO testing or xifaxin trial pending results  GERD, no dysphagia Patulous distal esophagus seen on recent CT in January, Unremarkable stomach Well controlled, consider EGD if any symptoms  History of hemorrhoids with rectal bleeding banding x 3. (12/2021, 01/2022, 03/2022)  Small volume BRB with straining once a week, some rectal discomfort, no itching, can get better with pressure/sitz bath Right posterior hemorrhoid on exam, healing anterior fissure -Sitz baths, increase fiber, increase water -Hydrocortisone  external cream sent in.  -We discussed hemorrhoid banding here in the office for internal hemorrhoids if not improving with conservative treatment, declines set up at this time - given camol 4 suppoistories - follow up 3 months  Family history of colon cancer Sister at age 22 Colonoscopy unremarkable 11/2022, tics  and hemorrhoids    Patient Care Team: Georgean Kindle, MD as PCP - General (Internal Medicine)  HISTORY OF PRESENT ILLNESS: 73 y.o. female with a past medical history listed below presents for evaluation of abdominal pain possible diverticulitis.   Last saw Dr. Venice Gillis in the office 01/11/2023 for rectal bleeding secondary to hemorrhoids unremarkable colonoscopy 11/2022.  Discussed the use of AI scribe software for clinical note transcription with the patient, who gave verbal consent to proceed.  History of Present Illness   Audrey Pratt is a 73 year old female with diverticulosis and hemorrhoids who presents with rectal bleeding and abdominal discomfort.  She experiences rectal bleeding approximately once or twice a week, which is usually bright red and often associated with straining during bowel movements. The bleeding can be stopped by applying pressure or soaking in a tub of water. She experiences rectal discomfort but no tearing, burning, or itching.  She has a history of diverticulosis, with a colonoscopy in September 2024 revealing diverticula in the sigmoid colon and hemorrhoids, but no polyps. In January, a CT scan showed a significant amount of stool in the colon but no diverticulitis. In April, she suspected diverticulitis and was treated with Augmentin , although her labs were normal. She occasionally experiences left lower quadrant abdominal pain, which resolves after a bowel movement.  She is currently taking Linzess 290 mcg once daily, which helps her have daily bowel movements, although she sometimes experiences constipation or incomplete evacuation. She uses Miralax every other day to help soften her stool.  Her family history is significant for a sister diagnosed with colon cancer at age 69.  In terms of her diet, she consumes applesauce, spinach, eggs, chicken, and fish, and avoids pork and beef. She uses almond milk  instead of regular milk and drinks green tea and water,  limiting her coffee intake to two cups a day.  No fevers, chills, weight loss, nausea, vomiting, abdominal pain, fecal incontinence, or urinary incontinence.      She  reports that she has never smoked. She has never used smokeless tobacco. She reports that she does not currently use alcohol. She reports that she does not use drugs.  RELEVANT GI HISTORY, IMAGING AND LABS: Results   RADIOLOGY CT: Large amount of stool in colon, no diverticulitis (03/2023)  DIAGNOSTIC Colonoscopy: Hemorrhoids, diverticula in sigmoid colon, no polyps removed (September 2024)     Colon 11/25/2022 - A few medium- mouthed diverticula were found in the sigmoid colon. - Non- bleeding internal hemorrhoids were found during retroflexion. The hemorrhoids were moderate and Grade I ( internal hemorrhoids that do not prolapse) . Did have some red wale markings suggestive of recent bleeding. - The terminal ileum appeared normal. - The exam was otherwise without abnormality on direct and retroflexion views. CBC    Component Value Date/Time   WBC 4.5 06/21/2023 1637   RBC 4.68 06/21/2023 1637   HGB 13.7 06/21/2023 1637   HCT 40.7 06/21/2023 1637   PLT 156.0 06/21/2023 1637   MCV 86.9 06/21/2023 1637   MCH 29.5 03/29/2023 1343   MCHC 33.6 06/21/2023 1637   RDW 13.5 06/21/2023 1637   LYMPHSABS 1.3 06/21/2023 1637   MONOABS 0.4 06/21/2023 1637   EOSABS 0.0 06/21/2023 1637   BASOSABS 0.0 06/21/2023 1637   Recent Labs    01/24/23 1150 03/29/23 1343 03/29/23 2328 06/21/23 1637  HGB 13.5 13.3 12.8 13.7    CMP     Component Value Date/Time   NA 141 06/21/2023 1637   K 3.9 06/21/2023 1637   CL 105 06/21/2023 1637   CO2 30 06/21/2023 1637   GLUCOSE 81 06/21/2023 1637   BUN 10 06/21/2023 1637   CREATININE 0.80 06/21/2023 1637   CALCIUM 9.3 06/21/2023 1637   PROT 6.7 06/21/2023 1637   ALBUMIN 4.8 06/21/2023 1637   AST 15 06/21/2023 1637   ALT 10 06/21/2023 1637   ALKPHOS 40 06/21/2023 1637   BILITOT 0.6  06/21/2023 1637   GFRNONAA >60 03/29/2023 1343      Latest Ref Rng & Units 06/21/2023    4:37 PM 03/29/2023    1:43 PM 01/24/2023   11:50 AM  Hepatic Function  Total Protein 6.0 - 8.3 g/dL 6.7  5.9  6.0   Albumin 3.5 - 5.2 g/dL 4.8  4.0  3.9   AST 0 - 37 U/L 15  18  18    ALT 0 - 35 U/L 10  14  14    Alk Phosphatase 39 - 117 U/L 40  31  35   Total Bilirubin 0.2 - 1.2 mg/dL 0.6  0.8  0.7       Current Medications:   Current Outpatient Medications (Endocrine & Metabolic):    PROLIA 60 MG/ML SOSY injection, Inject 60 mg into the skin every 6 (six) months.   Current Outpatient Medications (Respiratory):    cetirizine (ZYRTEC) 10 MG tablet, Zyrtec 10 mg tablet  Take 1 tablet every day by oral route.   fluticasone  (FLONASE ) 50 MCG/ACT nasal spray, Place 2 sprays into both nostrils daily.    Current Outpatient Medications (Other):    Calcium Carbonate+Vitamin D 600-200 MG-UNIT TABS, 1 tablet with a meal   Cholecalciferol (VITAMIN D3 PO), Take 1 tablet by mouth daily in the afternoon.  docusate (COLACE) 50 MG/5ML liquid, Take by mouth daily.   fluocinonide cream (LIDEX) 0.05 %, 1 Application as needed.   fluticasone  (CUTIVATE ) 0.05 % cream, Apply topically 2 (two) times daily. For 10 days and then use as needed   lidocaine  (LIDODERM ) 5 %, Place 1 patch onto the skin daily.   linaclotide (LINZESS) 290 MCG CAPS capsule, Take 1 capsule by mouth every other day.   Multiple Vitamins-Minerals (CENTRUM SILVER ULTRA WOMENS) TABS, See admin instructions.   omeprazole (PRILOSEC) 20 MG capsule, Take 20 mg by mouth daily. Takes 30 minutes before breakfast   triamcinolone ointment (KENALOG) 0.1 %, Apply 1 Application topically daily.   Wheat Dextrin (BENEFIBER PO), Take 1-2 tablets by mouth as needed.   hydrocortisone  (ANUSOL -HC) 2.5 % rectal cream, Place 1 Application rectally 3 (three) times daily.   lidocaine  (XYLOCAINE ) 5 % ointment, Apply 1 Application topically as needed.  Medical History:   Past Medical History:  Diagnosis Date   Allergy    Arthritis    GERD (gastroesophageal reflux disease)    Allergies:  Allergies  Allergen Reactions   Pneumococcal Vac Polyvalent     Swelling and redness at injection site   Latex Rash     Surgical History:  She  has a past surgical history that includes Partial hysterectomy; Rotator cuff repair (Right); Breast lumpectomy (Right); Hand tendon surgery (Right); and Colonoscopy (06/25/2015). Family History:  Her family history includes Colon cancer in her sister; Heart disease in her father and mother; Prostate cancer in her brother and father; Stomach cancer in her maternal grandmother; Throat cancer in her brother.  REVIEW OF SYSTEMS  : All other systems reviewed and negative except where noted in the History of Present Illness.  PHYSICAL EXAM: BP 124/78   Pulse 70   Ht 5\' 7"  (1.702 m)   Wt 149 lb (67.6 kg)   BMI 23.34 kg/m  Physical Exam   GENERAL APPEARANCE: Well nourished, in no apparent distress. HEENT: No cervical lymphadenopathy, unremarkable thyroid, sclerae anicteric, conjunctiva pink. RESPIRATORY: Respiratory effort normal, breath sounds equal bilaterally without rales, rhonchi, or wheezing. CARDIO: Regular rate and rhythm with no murmurs, rubs, or gallops, peripheral pulses intact. ABDOMEN: Soft, non-distended, active bowel sounds in all four quadrants, no tenderness to palpation, no rebound, no mass appreciated. RECTAL: Possible healing anterior anal fissure, right posterior hemorrhoid present, no rectal masses palpated, hemoccult test negative. Procedure: Anoscopy Description: anoscope inserted into the rectum. Observed a large right posterior hemorrhoid and a healing anterior fissure. No masses or significant stool present. Informed Consent: Consent obtained for endoscopy to visualize the rectum. Explained the procedure, including its purpose to identify any abnormalities. MUSCULOSKELETAL: Full range of motion,  normal gait, without edema. SKIN: Dry, intact without rashes or lesions. No jaundice. NEURO: Alert, oriented, no focal deficits. PSYCH: Cooperative, normal mood and affect.      Edmonia Gottron, PA-C 3:38 PM

## 2023-10-25 ENCOUNTER — Encounter (HOSPITAL_COMMUNITY): Payer: Self-pay | Admitting: *Deleted

## 2023-10-25 ENCOUNTER — Emergency Department (HOSPITAL_COMMUNITY)

## 2023-10-25 ENCOUNTER — Emergency Department (HOSPITAL_COMMUNITY)
Admission: EM | Admit: 2023-10-25 | Discharge: 2023-10-25 | Disposition: A | Discharge status: Home / Self Care | Attending: Emergency Medicine | Admitting: Emergency Medicine

## 2023-10-25 ENCOUNTER — Other Ambulatory Visit: Payer: Self-pay

## 2023-10-25 DIAGNOSIS — Z91040 Latex allergy status: Secondary | ICD-10-CM | POA: Insufficient documentation

## 2023-10-25 DIAGNOSIS — K921 Melena: Secondary | ICD-10-CM | POA: Diagnosis not present

## 2023-10-25 DIAGNOSIS — R1032 Left lower quadrant pain: Secondary | ICD-10-CM | POA: Diagnosis present

## 2023-10-25 LAB — CBC WITH DIFFERENTIAL/PLATELET
Abs Immature Granulocytes: 0.01 K/uL (ref 0.00–0.07)
Basophils Absolute: 0 K/uL (ref 0.0–0.1)
Basophils Relative: 0 %
Eosinophils Absolute: 0 K/uL (ref 0.0–0.5)
Eosinophils Relative: 0 %
HCT: 38.8 % (ref 36.0–46.0)
Hemoglobin: 13.4 g/dL (ref 12.0–15.0)
Immature Granulocytes: 0 %
Lymphocytes Relative: 23 %
Lymphs Abs: 1.2 K/uL (ref 0.7–4.0)
MCH: 29.1 pg (ref 26.0–34.0)
MCHC: 34.5 g/dL (ref 30.0–36.0)
MCV: 84.3 fL (ref 80.0–100.0)
Monocytes Absolute: 0.3 K/uL (ref 0.1–1.0)
Monocytes Relative: 6 %
Neutro Abs: 3.7 K/uL (ref 1.7–7.7)
Neutrophils Relative %: 71 %
Platelets: 153 K/uL (ref 150–400)
RBC: 4.6 MIL/uL (ref 3.87–5.11)
RDW: 13.5 % (ref 11.5–15.5)
WBC: 5.3 K/uL (ref 4.0–10.5)
nRBC: 0 % (ref 0.0–0.2)

## 2023-10-25 LAB — COMPREHENSIVE METABOLIC PANEL WITH GFR
ALT: 13 U/L (ref 0–44)
AST: 20 U/L (ref 15–41)
Albumin: 4 g/dL (ref 3.5–5.0)
Alkaline Phosphatase: 35 U/L — ABNORMAL LOW (ref 38–126)
Anion gap: 14 (ref 5–15)
BUN: 12 mg/dL (ref 8–23)
CO2: 22 mmol/L (ref 22–32)
Calcium: 9.2 mg/dL (ref 8.9–10.3)
Chloride: 105 mmol/L (ref 98–111)
Creatinine, Ser: 0.78 mg/dL (ref 0.44–1.00)
GFR, Estimated: >60 mL/min (ref >60)
Glucose, Bld: 101 mg/dL — ABNORMAL HIGH (ref 70–99)
Potassium: 3.9 mmol/L (ref 3.5–5.1)
Sodium: 141 mmol/L (ref 135–145)
Total Bilirubin: 0.9 mg/dL (ref 0.0–1.2)
Total Protein: 6.2 g/dL — ABNORMAL LOW (ref 6.5–8.1)

## 2023-10-25 LAB — URINALYSIS, ROUTINE W REFLEX MICROSCOPIC
Bilirubin Urine: NEGATIVE
Glucose, UA: NEGATIVE mg/dL
Hgb urine dipstick: NEGATIVE
Ketones, ur: NEGATIVE mg/dL
Nitrite: NEGATIVE
Protein, ur: NEGATIVE mg/dL
Specific Gravity, Urine: 1.012 (ref 1.005–1.030)
pH: 5 (ref 5.0–8.0)

## 2023-10-25 LAB — CBC
HCT: 37.1 % (ref 36.0–46.0)
Hemoglobin: 12.6 g/dL (ref 12.0–15.0)
MCH: 28.8 pg (ref 26.0–34.0)
MCHC: 34 g/dL (ref 30.0–36.0)
MCV: 84.7 fL (ref 80.0–100.0)
Platelets: 131 K/uL — ABNORMAL LOW (ref 150–400)
RBC: 4.38 MIL/uL (ref 3.87–5.11)
RDW: 13.4 % (ref 11.5–15.5)
WBC: 4.1 K/uL (ref 4.0–10.5)
nRBC: 0 % (ref 0.0–0.2)

## 2023-10-25 LAB — POC OCCULT BLOOD, ED: Fecal Occult Bld: NEGATIVE

## 2023-10-25 LAB — TYPE AND SCREEN
ABO/RH(D): AB POS
Antibody Screen: NEGATIVE

## 2023-10-25 MED ORDER — IOHEXOL 350 MG/ML SOLN
75.0000 mL | Freq: Once | INTRAVENOUS | Status: AC | PRN
  Administered 2023-10-25 (×2): 75 mL via INTRAVENOUS

## 2023-10-25 NOTE — ED Provider Notes (Addendum)
 [place] EMERGENCY DEPARTMENT AT [facility] Provider Note   CSN: [id number] Arrival date & time: 10/25/23  1510     Patient presents with: GI Bleeding   Audrey Pratt is a 73 y.o. female.   Patient here with rectal bleeding.  She has had some left lower abdominal pain.  She has had some intermittent bleeding these last several days with her last episode today.  She has history of hemorrhoids with banding in the past.  Sounds like history of polyps as well.  Follows with [facility].  She is not on any anticoagulation.  She states she had 1 episode of bright red blood per her stool today.  But has not had any since.  The history is provided by the patient.       Prior to Admission medications   Medication Sig Start Date End Date Taking? Authorizing Provider  Calcium Carbonate+Vitamin D 600-200 MG-UNIT TABS 1 tablet with a meal    [provider]  cetirizine (ZYRTEC) 10 MG tablet Zyrtec 10 mg tablet  Take 1 tablet every day by oral route.    [provider]  Cholecalciferol (VITAMIN D3 PO) Take 1 tablet by mouth daily in the afternoon.    [provider]  docusate (COLACE) 50 MG/5ML liquid Take by mouth daily.    [provider]  fluocinonide cream (LIDEX) 0.05 % 1 Application as needed. 05/31/18   [provider]  fluticasone  (CUTIVATE ) 0.05 % cream Apply topically 2 (two) times daily. For 10 days and then use as needed 03/19/22   Charlanne Groom, MD  fluticasone  (FLONASE ) 50 MCG/ACT nasal spray Place 2 sprays into both nostrils daily.    [provider]  hydrocortisone  (ANUSOL -HC) 2.5 % rectal cream Place 1 Application rectally 3 (three) times daily. 07/27/23   Craig Alan SAUNDERS, PA-C  lidocaine  (LIDODERM ) 5 % Place 1 patch onto the skin daily. 07/29/22   [provider]  lidocaine  (XYLOCAINE ) 5 % ointment Apply 1 Application topically as needed. 01/24/23   Veta Alan, PA-C  linaclotide (LINZESS) 290 MCG CAPS  capsule Take 1 capsule by mouth every other day.    [provider]  Multiple Vitamins-Minerals (CENTRUM SILVER ULTRA WOMENS) TABS See admin instructions.    [provider]  omeprazole (PRILOSEC) 20 MG capsule Take 20 mg by mouth daily. Takes 30 minutes before breakfast    [provider]  PROLIA 60 MG/ML SOSY injection Inject 60 mg into the skin every 6 (six) months. 06/13/21   [provider]  triamcinolone ointment (KENALOG) 0.1 % Apply 1 Application topically daily.    [provider]  Wheat Dextrin (BENEFIBER PO) Take 1-2 tablets by mouth as needed.    [provider]    Allergies: Pneumococcal vac polyvalent and Latex    Review of Systems  Updated Vital Signs BP (!) 119/95 (BP Location: Right Arm)   Pulse 78   Temp 98 F (36.7 C) (Oral)   Resp 19   Wt 67.6 kg   SpO2 100%   BMI 23.34 kg/m   Physical Exam Vitals and nursing note reviewed.  Constitutional:      General: She is not in acute distress.    Appearance: She is well-developed. She is not ill-appearing.  HENT:     Head: Normocephalic and atraumatic.     Nose: Nose normal.     Mouth/Throat:     Mouth: Mucous membranes are moist.  Eyes:     Extraocular  Movements: Extraocular movements intact.     Conjunctiva/sclera: Conjunctivae normal.     Pupils: Pupils are equal, round, and reactive to light.  Cardiovascular:     Rate and Rhythm: Normal rate and regular rhythm.     Pulses: Normal pulses.     Heart sounds: Normal heart sounds. No murmur heard. Pulmonary:     Effort: Pulmonary effort is normal. No respiratory distress.     Breath sounds: Normal breath sounds.  Abdominal:     General: Abdomen is flat.     Palpations: Abdomen is soft.     Tenderness: There is no abdominal tenderness.  Genitourinary:    Comments: Unable to get a great stool sample in her rectal vault, no obvious external hemorrhoids or internal hemorrhoids Musculoskeletal:        General:  No swelling.     Cervical back: Normal range of motion and neck supple.  Skin:    General: Skin is warm and dry.     Capillary Refill: Capillary refill takes less than 2 seconds.  Neurological:     General: No focal deficit present.     Mental Status: She is alert and oriented to person, place, and time.     Cranial Nerves: No cranial nerve deficit.     Sensory: No sensory deficit.     Motor: No weakness.     Coordination: Coordination normal.  Psychiatric:        Mood and Affect: Mood normal.     (all labs ordered are listed, but only abnormal results are displayed) Labs Reviewed  COMPREHENSIVE METABOLIC PANEL WITH GFR - Abnormal; Notable for the following components:      Result Value   Glucose, Bld 101 (*)    Total Protein 6.2 (*)    Alkaline Phosphatase 35 (*)    All other components within normal limits  URINALYSIS, ROUTINE W REFLEX MICROSCOPIC - Abnormal; Notable for the following components:   APPearance HAZY (*)    Leukocytes,Ua LARGE (*)    Bacteria, UA RARE (*)    Non Squamous Epithelial 0-5 (*)    All other components within normal limits  CBC - Abnormal; Notable for the following components:   Platelets 131 (*)    All other components within normal limits  CBC WITH DIFFERENTIAL/PLATELET  POC OCCULT BLOOD, ED  TYPE AND SCREEN    EKG: None  Radiology: CT ANGIO GI BLEED Result Date: 10/25/2023 CLINICAL DATA:  GI bleeding EXAM: CTA ABDOMEN AND PELVIS WITHOUT AND WITH CONTRAST TECHNIQUE: Multidetector CT imaging of the abdomen and pelvis was performed using the standard protocol during bolus administration of intravenous contrast. Multiplanar reconstructed images and MIPs were obtained and reviewed to evaluate the vascular anatomy. RADIATION DOSE REDUCTION: This exam was performed according to the departmental dose-optimization program which includes automated exposure control, adjustment of the mA and/or kV according to patient size and/or use of iterative  reconstruction technique. CONTRAST:  75mL OMNIPAQUE  IOHEXOL  350 MG/ML SOLN COMPARISON:  CT abdomen and pelvis 03/29/2023 FINDINGS: VASCULAR Aorta: Normal caliber aorta without aneurysm, dissection, vasculitis or significant stenosis. Atherosclerotic calcifications are present. Celiac: Patent without evidence of aneurysm, dissection, vasculitis or significant stenosis. SMA: Patent without evidence of aneurysm, dissection, vasculitis or significant stenosis. Renals: Both renal arteries are patent without evidence of aneurysm, dissection, vasculitis, fibromuscular dysplasia or significant stenosis. IMA: Patent without evidence of aneurysm, dissection, vasculitis or significant stenosis. Inflow: Patent without evidence of aneurysm, dissection, vasculitis or significant stenosis. Proximal Outflow: Bilateral common femoral and  visualized portions of the superficial and profunda femoral arteries are patent without evidence of aneurysm, dissection, vasculitis or significant stenosis. Veins: No obvious venous abnormality within the limitations of this arterial phase study. Review of the MIP images confirms the above findings. NON-VASCULAR Lower chest: No acute abnormality. Hepatobiliary: Peripherally enhancing low-attenuation area in the right lobe of the liver measures 2.1 x 2.9 cm most compatible with hemangioma. There are additional smaller hypodensities scattered throughout the liver measuring up to 11 mm which may represent cysts or hemangiomas. The gallbladder and bile ducts are within normal limits. Pancreas: Unremarkable. No pancreatic ductal dilatation or surrounding inflammatory changes. Spleen: Normal in size without focal abnormality. Adrenals/Urinary Tract: Adrenal glands are unremarkable. There is a rounded hypodensity in the left kidney which is too small to characterize, likely a cyst. Otherwise, the kidneys and adrenal glands appear within normal limits. Bladder is unremarkable. Stomach/Bowel: There is some  mild residual scattered hyperdense material throughout the colon which limits evaluation for acute hemorrhage. No acute gastrointestinal hemorrhage identified. No acute inflammatory stranding, pneumatosis, bowel obstruction or free air. Stomach and small bowel are within normal limits. Appendix appears normal. Lymphatic: No enlarged lymph nodes are identified. Reproductive: Status post hysterectomy. No adnexal masses. Other: No abdominal wall hernia or abnormality. No abdominopelvic ascites. Musculoskeletal: Degenerative changes affect a spine and hips. IMPRESSION: 1. No evidence for acute gastrointestinal hemorrhage. 2.  No acute localizing process in the abdomen or pelvis. 3. Hepatic hemangioma. Additional smaller hypodensities in the liver may represent cysts or hemangiomas. Aortic Atherosclerosis (ICD10-I70.0). Electronically Signed   By: Greig Pique M.D.   On: 10/25/2023 20:58     Procedures   Medications Ordered in the ED  iohexol  (OMNIPAQUE ) 350 MG/ML injection 75 mL (75 mLs Intravenous Contrast Given 10/25/23 1827)                                    Medical Decision Making Amount and/or Complexity of Data Reviewed Labs: ordered. Radiology: ordered.  Risk Prescription drug management.   Audrey Pratt is here with abdominal pain constipation loose stool rectal bleeding hemorrhoid history.  She is not on any blood thinners.  She takes Colace and Linzess.  She has had hemorrhoidectomy in banding of her hemorrhoids in the past.  Grossly on exam I do not see any obvious blood on stool but not a great sample.  Hemoccult was negative.  I do not see any obvious hemorrhoids on exam.  She has had 1 episode of bleeding today that was mixed in with stool related to having a bowel movement.  She has not had any bleeding and not related to bowel movement.  She has had some intermittent blood on her toilet paper here the last few days as well.  Overall patient is very well-appearing.  CT scan was  ordered basic labs ordered.  Differential diagnosis likely hemorrhoid type bleeding.  Seems less likely to be a diverticular bleed or active GI bleed.  Hemoglobin initially came back at 13.4 and on repeat was 12.6.  Overall she has not had any other bloody bowel movements since being here.  Lab work showed no significant leukocytosis or electrolyte abnormality otherwise.  CT angio showed no evidence of any active GI bleed.  Overall my suspicion is this is likely hemorrhoidal type bleeding.  She is hemodynamically stable.  She has had fairly stable hemoglobin.  She has unremarkable CT angio.  Overall I think she stable for close outpatient follow-up with GI.  I have messaged GI team to help arrange for close follow-up.  Ultimately I think if she develops heavy bleeding/increased bleeding or other concerns she should return for reevaluation.  But at this time I think she stable for discharge.  Overall Dr. Yvette to help arrange for close follow-up.  Recommend that she start calmol 4 which has helped her in the past.  This chart was dictated using voice recognition software.  Despite best efforts to proofread,  errors can occur which can change the documentation meaning.      Final diagnoses:  Blood in stool    ED Discharge Orders     None          Ruthe Cornet, DO 10/25/23 2225    Ruthe Cornet, DO 10/25/23 2229

## 2023-10-25 NOTE — ED Notes (Signed)
 Pt transported to CT ?

## 2023-10-25 NOTE — ED Triage Notes (Addendum)
 Here by POV from home with family, here for LLQ pain, loose stool, constipation, rectal bleeding, hemorrhoids, tired. Denies NVD, sob, syncope, dizziness, light headed, fever, recent travel, or ABT. Onset 4d ago. Describes as recurrent. Takes colace and linzess. Describes as bright red. H/o similar. H/o hemorrhoidectomy/ banding.

## 2023-10-25 NOTE — Discharge Instructions (Addendum)
 Overall my suspicion is that you are likely having some blood from irritation of your rectum/hemorrhoid.  I would maybe consider restarting your Calmol 4 that you stated you had some success with in the past.  Make sure that you are using stool softeners as needed to prevent constipation, if you have any large bloody bowel movements or concerning bleeding please return for closer evaluation if you are not able to follow-up with your GI team.

## 2023-10-26 ENCOUNTER — Telehealth: Payer: Self-pay | Admitting: Physician Assistant

## 2023-10-26 NOTE — Telephone Encounter (Signed)
 Good morning  Received call from patient regarding ED follow up and persisting symptoms. Requesting medical advise. Please advise,   Thank You

## 2023-10-26 NOTE — Telephone Encounter (Signed)
 Called and spoke with patient. Patient was seen in ER yesterday for rectal bleeding. Labs and CT completed, hemodynamically stable and evidence of GI bleed on CT scan. Patient has been advised that she is likely experiencing hemorrhoidal bleeding, she has had bandings in the past. Patient states that the Hydrocortisone  suppositories worked but they were too expensive so she stopped taking them. Patient tried Preparation H suppositories with Hydrocortisone  cream and it did not work well. Patient has been scheduled for a follow up with Alan, PA on Thursday, 10/28/23 at 8:40 am for evaluation. Patient has been advised to go to the ER if she is passing blood independently without stool, or if she develops chest pain, SOB, dizziness, or fatigue. Patient verbalized understanding and had no concerns at the end of the call.

## 2023-10-27 NOTE — Progress Notes (Signed)
 10/28/2023 Audrey Pratt [id number] [birth year]  Referring provider: Pia Kerney SQUIBB, MD Primary GI doctor: Dr. Charlanne  ASSESSMENT AND PLAN:  Left lower quadrant abdominal discomfort Patient called 06/18/23 with possible diverticulitis had previous CT abdomen pelvis with contrast 03/2023 moderate volume formed stool and scattered diverticulosis without diverticulitis, given Augmentin  at that time with improvement of symptoms 10/25/2023 ER visit for LLQ pain and rectal bleeding CT angio without evidence of diverticulitis Colonoscopy normal 11/2022 Believe this is more IBS - patient reassured -Can do trial of IBGARD daily, will give Levsin  -FODMAP,  and lifestyle changes discussed  IBS constipation On Linzess 290 and Colace, has BM daily but can be every other day but can feel incomplete Moderate formed stool with CT abdomen pelvis in January -Can do trial of IBGARD daily -FODMAP,  and lifestyle changes discussed -possible component of pelvis floor dysfunction with history and symptoms, given information -continue miralax with linzess, consider motegrity or isbrela  -Consider SIBO testing or xifaxin trial pending results  GERD, no dysphagia Patulous distal esophagus seen on recent CT in January Unremarkable stomach Well controlled, consider EGD versus barium swallow if any symptoms  History of hemorrhoids with rectal bleeding,  Some rectal pain with bathroom, small volume BRB in stool but one episode of large volume, some burning, no itching banding x 3. (12/2021, 01/2022, 03/2022)  Small volume BRB with straining once a week, some rectal discomfort, no itching, can get better with pressure/sitz bath Right posterior hemorrhoid on exam, and anterior fissure on endoscopy -Sitz baths, increase fiber, increase water -Hydrocortisone  supp sent in.  -We discussed hemorrhoid banding here in the office for internal hemorrhoids if not improving with conservative treatment, declines set up at  this time, will just do follow up with Dr. Charlanne - given diltizem/lidocaine  - follow up 3 months  Family history of colon cancer Sister at age 53 Colonoscopy unremarkable 11/2022, tics and hemorrhoids   Patient Care Team: Pia Kerney SQUIBB, MD as PCP - General (Internal Medicine)  HISTORY OF PRESENT ILLNESS: 73 y.o. female with a past medical history listed below presents for evaluation of abdominal pain possible diverticulitis.   Last seen in the office 07/27/2023 for left lower quadrant abdominal discomfort constipation GERD and rectal bleeding.   Patient had unremarkable colonoscopy 11/2022.   Patient had recent ER visit 10/25/2023 for left lower quadrant abdominal discomfort and rectal bleeding.  CT angio showed no evidence of gastrointestinal hemorrhage no acute process in the abdomen.  No diverticulitis.  Showed hepatic hemangioma.   FOBT negative, CBC with hemoglobin 13.4 and stable  Discussed the use of AI scribe software for clinical note transcription with the patient, who gave verbal consent to proceed.  History of Present Illness   Audrey Pratt is a 73 year old female with diverticulosis and hemorrhoids who presents with rectal bleeding and abdominal pain.  She experienced rectal bleeding and abdominal pain, prompting an emergency room visit three days ago. A CT angiography was performed during her ER visit, which she recalls did not show any bleeding within the intestinal tract. Her fecal occult blood test was negative, and her hemoglobin levels were stable.  The rectal bleeding is characterized by bright red blood in small volumes, with a larger volume noted before the ER visit. Since then, she has observed a small amount of blood when using the bathroom. She experiences mild pain after defecation, sometimes with a slight burning sensation, but no itching.  She has a history of  constipation and takes Linzess 290 mcg daily, which generally facilitates daily bowel movements.  Occasionally, she skips a day if her stomach is upset. Her bowel movements feel more complete now, although they can be hard at times. She manages her symptoms with dietary adjustments, such as eating spinach with her eggs.  She experiences left lower abdominal pain, which worsens if she does not have a bowel movement. This pain was present during her recent ER visit. She has not used medications like dicyclomine  for this pain, relying instead on a heating pad for relief.  She has a history of hemorrhoids, which have been banded previously. She uses hydrocortisone  suppositories as needed for flare-ups. She has not been taking Miralax recently as her bowel movements have improved, but she has it on hand if needed.  Her heartburn is well controlled. No shortness of breath or chest pain.      She  reports that she has never smoked. She has never used smokeless tobacco. She reports that she does not currently use alcohol. She reports that she does not use drugs.  RELEVANT GI HISTORY, IMAGING AND LABS: Results   RADIOLOGY CT: Large amount of stool in colon, no diverticulitis (03/2023)  DIAGNOSTIC Colonoscopy: Hemorrhoids, diverticula in sigmoid colon, no polyps removed (September 2024)     Colon 11/25/2022 - A few medium- mouthed diverticula were found in the sigmoid colon. - Non- bleeding internal hemorrhoids were found during retroflexion. The hemorrhoids were moderate and Grade I ( internal hemorrhoids that do not prolapse) . Did have some red wale markings suggestive of recent bleeding. - The terminal ileum appeared normal. - The exam was otherwise without abnormality on direct and retroflexion views. CBC    Component Value Date/Time   WBC 4.1 10/25/2023 2020   RBC 4.38 10/25/2023 2020   HGB 12.6 10/25/2023 2020   HCT 37.1 10/25/2023 2020   PLT 131 (L) 10/25/2023 2020   MCV 84.7 10/25/2023 2020   MCH 28.8 10/25/2023 2020   MCHC 34.0 10/25/2023 2020   RDW 13.4 10/25/2023 2020    LYMPHSABS 1.2 10/25/2023 1546   MONOABS 0.3 10/25/2023 1546   EOSABS 0.0 10/25/2023 1546   BASOSABS 0.0 10/25/2023 1546   Recent Labs    01/24/23 1150 03/29/23 1343 03/29/23 2328 06/21/23 1637 10/25/23 1546 10/25/23 2020  HGB 13.5 13.3 12.8 13.7 13.4 12.6    CMP     Component Value Date/Time   NA 141 10/25/2023 1546   K 3.9 10/25/2023 1546   CL 105 10/25/2023 1546   CO2 22 10/25/2023 1546   GLUCOSE 101 (H) 10/25/2023 1546   BUN 12 10/25/2023 1546   CREATININE 0.78 10/25/2023 1546   CALCIUM 9.2 10/25/2023 1546   PROT 6.2 (L) 10/25/2023 1546   ALBUMIN 4.0 10/25/2023 1546   AST 20 10/25/2023 1546   ALT 13 10/25/2023 1546   ALKPHOS 35 (L) 10/25/2023 1546   BILITOT 0.9 10/25/2023 1546   GFRNONAA >60 10/25/2023 1546      Latest Ref Rng & Units 10/25/2023    3:46 PM 06/21/2023    4:37 PM 03/29/2023    1:43 PM  Hepatic Function  Total Protein 6.5 - 8.1 g/dL 6.2  6.7  5.9   Albumin 3.5 - 5.0 g/dL 4.0  4.8  4.0   AST 15 - 41 U/L 20  15  18    ALT 0 - 44 U/L 13  10  14    Alk Phosphatase 38 - 126 U/L 35  40  31  Total Bilirubin 0.0 - 1.2 mg/dL 0.9  0.6  0.8       Current Medications:   Current Outpatient Medications (Endocrine & Metabolic):    PROLIA 60 MG/ML SOSY injection, Inject 60 mg into the skin every 6 (six) months.   Current Outpatient Medications (Respiratory):    cetirizine (ZYRTEC) 10 MG tablet, Zyrtec 10 mg tablet  Take 1 tablet every day by oral route.   fluticasone  (FLONASE ) 50 MCG/ACT nasal spray, Place 2 sprays into both nostrils daily.    Current Outpatient Medications (Other):    Calcium Carbonate+Vitamin D 600-200 MG-UNIT TABS, 1 tablet with a meal   Cholecalciferol (VITAMIN D3 PO), Take 1 tablet by mouth daily in the afternoon.   docusate (COLACE) 50 MG/5ML liquid, Take by mouth daily.   fluocinonide cream (LIDEX) 0.05 %, 1 Application as needed.   fluticasone  (CUTIVATE ) 0.05 % cream, Apply topically 2 (two) times daily. For 10 days and then use  as needed   hydrocortisone  (ANUSOL -HC) 2.5 % rectal cream, Place 1 Application rectally 3 (three) times daily.   hydrocortisone  (ANUSOL -HC) 25 MG suppository, Place 1 suppository (25 mg total) rectally 2 (two) times daily.   hyoscyamine  (LEVSIN ) 0.125 MG tablet, Take 1 tablet (0.125 mg total) by mouth every 6 (six) hours as needed for cramping.   lidocaine  (LIDODERM ) 5 %, Place 1 patch onto the skin daily.   linaclotide (LINZESS) 290 MCG CAPS capsule, Take 1 capsule by mouth every other day.   Multiple Vitamins-Minerals (CENTRUM SILVER ULTRA WOMENS) TABS, See admin instructions.   NON FORMULARY, Diltiazem 2%/Lidocaine5% compound Use 3 x rectally daily for 2 months to heal anal fissure   omeprazole (PRILOSEC) 20 MG capsule, Take 20 mg by mouth daily. Takes 30 minutes before breakfast   triamcinolone ointment (KENALOG) 0.1 %, Apply 1 Application topically daily.  Medical History:  Past Medical History:  Diagnosis Date   Allergy    Arthritis    GERD (gastroesophageal reflux disease)    Allergies:  Allergies  Allergen Reactions   Pneumococcal Vac Polyvalent     Swelling and redness at injection site   Latex Rash     Surgical History:  She  has a past surgical history that includes Partial hysterectomy; Rotator cuff repair (Right); Breast lumpectomy (Right); Hand tendon surgery (Right); and Colonoscopy (06/25/2015). Family History:  Her family history includes Colon cancer in her sister; Heart disease in her father and mother; Prostate cancer in her brother and father; Stomach cancer in her maternal grandmother; Throat cancer in her brother.  REVIEW OF SYSTEMS  : All other systems reviewed and negative except where noted in the History of Present Illness.  PHYSICAL EXAM: BP 110/76 (BP Location: Left Arm, Patient Position: Sitting, Cuff Size: Normal)   Pulse 66   Ht 5' 7 (1.702 m)   Wt 150 lb 6 oz (68.2 kg)   BMI 23.55 kg/m  Physical Exam   GENERAL APPEARANCE: Well nourished, in  no apparent distress. HEENT: No cervical lymphadenopathy, unremarkable thyroid, sclerae anicteric, conjunctiva pink. RESPIRATORY: Respiratory effort normal, breath sounds equal bilaterally without rales, rhonchi, or wheezing. CARDIO: Regular rate and rhythm with no murmurs, rubs, or gallops, peripheral pulses intact. ABDOMEN: Soft, non-distended, active bowel sounds in all four quadrants, no tenderness to palpation, no rebound, no mass appreciated. RECTAL: no rectal masses palpated, hemoccult test negative. Procedure: Anoscopy Description: anoscope inserted into the rectum. Observed a large right posterior hemorrhoid and an anterior fissure. No masses or significant stool present. Informed Consent:  Consent obtained for endoscopy to visualize the rectum. Explained the procedure, including its purpose to identify any abnormalities. MUSCULOSKELETAL: Full range of motion, normal gait, without edema. SKIN: Dry, intact without rashes or lesions. No jaundice. NEURO: Alert, oriented, no focal deficits. PSYCH: Cooperative, normal mood and affect.       Alan JONELLE Coombs, PA-C 10:29 AM

## 2023-10-28 ENCOUNTER — Ambulatory Visit: Admitting: Physician Assistant

## 2023-10-28 ENCOUNTER — Encounter: Payer: Self-pay | Admitting: Physician Assistant

## 2023-10-28 VITALS — BP 110/76 | HR 66 | Ht 67.0 in | Wt 150.4 lb

## 2023-10-28 DIAGNOSIS — K648 Other hemorrhoids: Secondary | ICD-10-CM | POA: Diagnosis not present

## 2023-10-28 DIAGNOSIS — R1032 Left lower quadrant pain: Secondary | ICD-10-CM

## 2023-10-28 DIAGNOSIS — K625 Hemorrhage of anus and rectum: Secondary | ICD-10-CM | POA: Diagnosis not present

## 2023-10-28 DIAGNOSIS — K602 Anal fissure, unspecified: Secondary | ICD-10-CM

## 2023-10-28 DIAGNOSIS — Z860100 Personal history of colon polyps, unspecified: Secondary | ICD-10-CM

## 2023-10-28 DIAGNOSIS — K219 Gastro-esophageal reflux disease without esophagitis: Secondary | ICD-10-CM

## 2023-10-28 DIAGNOSIS — K581 Irritable bowel syndrome with constipation: Secondary | ICD-10-CM

## 2023-10-28 MED ORDER — HYDROCORTISONE ACETATE 25 MG RE SUPP: 25.0000 mg | Freq: Two times a day (BID) | RECTAL | 0 refills | Status: AC

## 2023-10-28 MED ORDER — HYOSCYAMINE SULFATE 0.125 MG PO TABS: 0.1250 mg | ORAL_TABLET | Freq: Four times a day (QID) | ORAL | 2 refills | Status: AC | PRN

## 2023-10-28 MED ORDER — NON FORMULARY: 1 refills | Status: AC

## 2023-10-28 NOTE — Patient Instructions (Addendum)
 _______________________________________________________  If your blood pressure at your visit was 140/90 or greater, please contact your primary care physician to follow up on this.  _______________________________________________________  If you are age 74 or older, your body mass index should be between 23-30. Your Body mass index is 23.55 kg/m. If this is out of the aforementioned range listed, please consider follow up with your Primary Care Provider.  If you are age 60 or younger, your body mass index should be between 19-25. Your Body mass index is 23.55 kg/m. If this is out of the aformentioned range listed, please consider follow up with your Primary Care Provider.   ________________________________________________________  The [facility] providers would like to encourage you to use MYCHART to communicate with providers for non-urgent requests or questions.  Due to long hold times on the telephone, sending your provider a message by [facility] may be a faster and more efficient way to get a response.  Please allow 48 business hours for a response.  Please remember that this is for non-urgent requests.  _______________________________________________________  [facility] is using a team-based approach to care.  Your team is made up of your doctor and two to three APPS. Our APPS (Nurse Practitioners and Physician Assistants) work with your physician to ensure care continuity for you. They are fully qualified to address your health concerns and develop a treatment plan. They communicate directly with your gastroenterologist to care for you. Seeing the Advanced Practice Practitioners on your physician's team can help you by facilitating care more promptly, often allowing for earlier appointments, access to diagnostic testing, procedures, and other specialty referrals.     Anal Fissure, Adult  A fissure is a linear defect in the anal mucosa, symptoms include burning, itching,  discomfort especially with a bowel movement with associated rectal bleeding.  Risk factors include low fiber diet, chronic constipation and straining. Anal fissures can take a very long time to heal so this will be a 2 to 28-month process.  Treatment for a fissure includes:  -decreasing time in the toilet should not be more than 5 minutes -adding fiber supplement such as Benefiber or Citrucel -increasing water. -I am also going to send in a calcium channel blocker cream to a compound pharmacy, apply twice daily for 12 weeks. If after 3 months this is not helpful then we will we will refer you to general surgery for evaluation, they can do Botox injections under anesthesia or surgery.  Diltiazem/lidocaine  3 x daily for 2 months sent to compound pharmacy   Sent this medication to a compound pharmacy:  [facility] [address] [place], [place], [place] [address]  [phone]  Please DO NOT go directly from our office to pick up this medication! Give the pharmacy 1 day to process the prescription. Extra time is required for them to compound your medication.  Miralax is an osmotic laxative.  It only brings more water into the stool.  This is safe to take daily.  Can take up to 17 gram of miralax twice a day.  Mix with juice or coffee.  Start 1 capful at night for 3-4 days and reassess your response in 3-4 days.  You can increase and decrease the dose based on your response.  Remember, it can take up to 3-4 days to take effect OR for the effects to wear off.   I often pair this with benefiber in the morning to help assure the stool is not too loose.   Toileting tips to  help with your constipation - Drink at least 64-80 ounces of water/liquid per day. - Establish a time to try to move your bowels every day.  For many people, this is after a cup of coffee or after a meal such as breakfast. - Sit all of the way back on the toilet keeping your back fairly straight and while sitting up,  try to rest the tops of your forearms on your upper thighs.   - Raising your feet with a step stool/squatty potty can be helpful to improve the angle that allows your stool to pass through the rectum. - Relax the rectum feeling it bulge toward the toilet water.  If you feel your rectum raising toward your body, you are contracting rather than relaxing. - Breathe in and slowly exhale. Belly breath by expanding your belly towards your belly button. Keep belly expanded as you gently direct pressure down and back to the anus.  A low pitched GRRR sound can assist with increasing intra-abdominal pressure.  (Can also trying to blow on a pinwheel and make it move, this helps with the same belly breathing) - Repeat 3-4 times. If unsuccessful, contract the pelvic floor to restore normal tone and get off the toilet.  Avoid excessive straining. - To reduce excessive wiping by teaching your anus to normally contract, place hands on outer aspect of knees and resist knee movement outward.  Hold 5-10 second then place hands just inside of knees and resist inward movement of knees.  Hold 5 seconds.  Repeat a few times each way.  Go to the ER if unable to pass gas, severe AB pain, unable to hold down food, any shortness of breath of chest pain.  First do a trial off milk/lactose products if you use them.  Add fiber like benefiber or citracel once a day Increase activity Can do trial of IBGard which is over the counter for AB pain- Take 1-2 capsules once a day for maintence or twice a day during a flare Can send in an anti spasm medication, Levsin , to take as needed Please try to decrease stress. consider talking with PCP about anti anxiety medication or try head space app for meditation. if any worsening symptoms like blood in stool, weight loss, please call the office     FODMAP stands for fermentable oligo-, di-, mono-saccharides and polyols (1). These are the scientific terms used to classify groups of carbs  that are difficult for our body to digest and that are notorious for triggering digestive symptoms like bloating, gas, loose stools and stomach pain.   You can try low FODMAP diet  - start with eliminating just one column at a time that you feel may be a trigger for you. - the table at the very bottom contains foods that are low in FODMAPs   Sometimes trying to eliminate the FODMAP's from your diet is difficult or tricky, if you are stuggling with trying to do the elimination diet you can try an enzyme.  There is a food enzymes that you sprinkle in or on your food that helps break down the FODMAP. You can read more about the enzyme by going to this site: https://fodzyme.com/    Here some information about pelvic floor dysfunction. This may be contributing to some of your symptoms. We will continue with our evaluation but I do want you to consider adding on fiber supplement with low-dose MiraLAX daily. We could also refer to pelvic floor physical therapy.   Pelvic Floor Dysfunction,  Female Pelvic floor dysfunction (PFD) is a condition that results when the group of muscles and connective tissues that support the organs in the pelvis (pelvic floor muscles) do not work well. These muscles and their connections form a sling that supports the colon and bladder. In women, they also support the uterus. PFD causes pelvic floor muscles to be too weak, too tight, or both. In PFD, muscle movements are not coordinated. This may cause bowel or bladder problems. It may also cause pain. What are the causes? This condition may be caused by an injury to the pelvic area or by a weakening of pelvic muscles. This often results from pregnancy and childbirth or other types of strain. In many cases, the exact cause is not known. What increases the risk? The following factors may make you more likely to develop this condition: Having chronic bladder tissue inflammation (interstitial cystitis). Being an older  person. Being overweight. History of radiation treatment for cancer in the pelvic region. Previous pelvic surgery, such as removal of the uterus (hysterectomy). What are the signs or symptoms? Symptoms of this condition vary and may include: Bladder symptoms, such as: Trouble starting urination and emptying the bladder. Frequent urinary tract infections. Leaking urine when coughing, laughing, or exercising (stress incontinence). Having to pass urine urgently or frequently. Pain when passing urine. Bowel symptoms, such as: Constipation. Urgent or frequent bowel movements. Incomplete bowel movements. Painful bowel movements. Leaking stool or gas. Unexplained genital or rectal pain. Genital or rectal muscle spasms. Low back pain. Other symptoms may include: A heavy, full, or aching feeling in the vagina. A bulge that protrudes into the vagina. Pain during or after sex. How is this diagnosed? This condition may be diagnosed based on: Your symptoms and medical history. A physical exam. During the exam, your health care provider may check your pelvic muscles for tightness, spasm, pain, or weakness. This may include a rectal exam and a pelvic exam. In some cases, you may have diagnostic tests, such as: Electrical muscle function tests. Urine flow testing. X-ray tests of bowel function. Ultrasound of the pelvic organs. How is this treated? Treatment for this condition depends on the symptoms. Treatment options include: Physical therapy. This may include Kegel exercises to help relax or strengthen the pelvic floor muscles. Biofeedback. This type of therapy provides feedback on how tight your pelvic floor muscles are so that you can learn to control them. Internal or external massage therapy. A treatment that involves electrical stimulation of the pelvic floor muscles to help control pain (transcutaneous electrical nerve stimulation, or TENS). Sound wave therapy (ultrasound) to reduce  muscle spasms. Medicines, such as: Muscle relaxants. Bladder control medicines. Surgery to reconstruct or support pelvic floor muscles may be an option if other treatments do not help. Follow these instructions at home: Activity Do your usual activities as told by your health care provider. Ask your health care provider if you should modify any activities. Do pelvic floor strengthening or relaxing exercises at home as told by your physical therapist. Lifestyle Maintain a healthy weight. Eat foods that are high in fiber, such as beans, whole grains, and fresh fruits and vegetables. Limit foods that are high in fat and processed sugars, such as fried or sweet foods. Manage stress with relaxation techniques such as yoga or meditation. General instructions If you have problems with leakage: Use absorbable pads or wear padded underwear. Wash frequently with mild soap. Keep your genital and anal area as clean and dry as possible. Ask your  health care provider if you should try a barrier cream to prevent skin irritation. Take warm baths to relieve pelvic muscle tension or spasms. Take over-the-counter and prescription medicines only as told by your health care provider. Keep all follow-up visits. How is this prevented? The cause of PFD is not always known, but there are a few things you can do to reduce the risk of developing this condition, including: Staying at a healthy weight. Getting regular exercise. Managing stress. Contact a health care provider if: Your symptoms are not improving with home care. You have signs or symptoms of PFD that get worse at home. You develop new signs or symptoms. You have signs of a urinary tract infection, such as: Fever. Chills. Increased urinary frequency. A burning feeling when urinating. You have not had a bowel movement in 3 days (constipation). Summary Pelvic floor dysfunction results when the muscles and connective tissues in your pelvic floor  do not work well. These muscles and their connections form a sling that supports your colon and bladder. In women, they also support the uterus. PFD may be caused by an injury to the pelvic area or by a weakening of pelvic muscles. PFD causes pelvic floor muscles to be too weak, too tight, or a combination of both. Symptoms may vary from person to person. In most cases, PFD can be treated with physical therapies and medicines. Surgery may be an option if other treatments do not help. This information is not intended to replace advice given to you by your health care provider. Make sure you discuss any questions you have with your health care provider. Document Revised: 07/10/2020 Document Reviewed: 07/10/2020 Elsevier Patient Education  2022 [employer].

## 2023-11-23 ENCOUNTER — Ambulatory Visit: Admitting: Physician Assistant

## 2023-11-29 ENCOUNTER — Encounter: Payer: Self-pay | Admitting: Gastroenterology

## 2024-03-06 ENCOUNTER — Other Ambulatory Visit: Payer: Self-pay | Admitting: Internal Medicine

## 2024-03-06 DIAGNOSIS — R921 Mammographic calcification found on diagnostic imaging of breast: Secondary | ICD-10-CM

## 2024-04-10 ENCOUNTER — Encounter

## 2024-04-26 ENCOUNTER — Encounter

## 2024-05-05 ENCOUNTER — Ambulatory Visit: Admitting: Physician Assistant
